# Patient Record
Sex: Female | Born: 1990 | Race: Black or African American | Hispanic: No | Marital: Single | Smoking: Never smoker
Health system: Southern US, Community
[De-identification: ages and names within clinical notes are randomized; demographics above are authoritative.]

## PROBLEM LIST (undated history)

## (undated) DIAGNOSIS — L709 Acne, unspecified: Secondary | ICD-10-CM

## (undated) HISTORY — PX: TONSILLECTOMY: SUR1361

## (undated) HISTORY — DX: Acne, unspecified: L70.9

---
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
## 2012-05-06 ENCOUNTER — Ambulatory Visit (INDEPENDENT_AMBULATORY_CARE_PROVIDER_SITE_OTHER): Payer: PRIVATE HEALTH INSURANCE | Admitting: Family Medicine

## 2012-05-06 VITALS — BP 122/74 | HR 64 | Temp 97.5°F | Resp 16 | Ht 66.5 in | Wt 123.4 lb

## 2012-05-06 DIAGNOSIS — R21 Rash and other nonspecific skin eruption: Secondary | ICD-10-CM

## 2012-05-06 MED ORDER — KETOCONAZOLE 2 % EX CREA: TOPICAL_CREAM | Freq: Every day | CUTANEOUS | Status: DC

## 2012-05-06 NOTE — Progress Notes (Signed)
[facility]:  Office Visit  Chief Complaint:  Chief Complaint  Patient presents with  . Rash    on eye lids bilaterally and around eyes x 3 weeks, has txd w/diaper rash ointment which helped, benedryl cream, benedryl tablets, claritin   . Edema     and itching x 3 weeks eyelids/eyebrows    HPI: Chelsea Lopez is a 21 y.o. female who complains of  Itchy eyelids, flaking skin and some puffiness around eyes x 3 weeks. Tried benadryl PO and topical with no relief. She tried a butt paste cream for diaper rash on her eyelids and that seem to help. She denies any h/o allergies. She is from [place], this started when she came down to school. Parents have a history of allergies and asthma on both sides. Denies using any new foods, new medicines, new make up, new soaps/face wash, clothing, towels, etc.  She is on minocycline for acne.   History reviewed. No pertinent past medical history. Past Surgical History  Procedure Date  . Tonsillectomy    History   Social History  . Marital Status: Single    Spouse Name: N/A    Number of Children: N/A  . Years of Education: N/A   Social History Main Topics  . Smoking status: Never Smoker   . Smokeless tobacco: None  . Alcohol Use: No  . Drug Use: No  . Sexually Active: None   Other Topics Concern  . None   Social History Narrative  . None   Family History  Problem Relation Age of Onset  . Diabetes Maternal Grandmother   . Heart disease Maternal Grandmother   . Stroke Maternal Grandmother   . Diabetes Paternal Grandmother    No Known Allergies Prior to Admission medications   Medication Sig Start Date End Date Taking? Authorizing Provider  sulfamethoxazole-trimethoprim (BACTRIM,SEPTRA) 400-80 MG per tablet Take 1 tablet by mouth 2 (two) times daily.    Historical Provider, MD     ROS: The patient denies fevers, chills, night sweats, unintentional weight loss, chest pain, palpitations, wheezing, dyspnea on  exertion, nausea, vomiting, abdominal pain, dysuria, hematuria, melena, numbness, weakness, or tingling.   All other systems have been reviewed and were otherwise negative with the exception of those mentioned in the HPI and as above.    PHYSICAL EXAM: Filed Vitals:   05/06/12 1557  BP: 122/74  Pulse: 64  Temp: 97.5 F (36.4 C)  Resp: 16   Filed Vitals:   05/06/12 1557  Height: 5' 6.5" (1.689 m)  Weight: 123 lb 6.4 oz (55.974 kg)   Body mass index is 19.62 kg/(m^2).  General: Alert, no acute distress HEENT:  Normocephalic, atraumatic, oropharynx patent.  Cardiovascular:  Regular rate and rhythm, no rubs murmurs or gallops.  No Carotid bruits, radial pulse intact. No pedal edema.  Respiratory: Clear to auscultation bilaterally.  No wheezes, rales, or rhonchi.  No cyanosis, no use of accessory musculature GI: No organomegaly, abdomen is soft and non-tender, positive bowel sounds.  No masses. Skin: + flaky scales on upper eyelid bilaterally, skin around eyes slightly erythematous. ? Seborrhea,  Neurologic: Facial musculature symmetric. Psychiatric: Patient is appropriate throughout our interaction. Lymphatic: No cervical lymphadenopathy Musculoskeletal: Gait intact.   LABS: Results for orders placed in visit on 12/08/11  POCT CBC      Component Value Range   WBC 7.5  4.6 - 10.2 K/uL   Lymph, poc 2.0  0.6 - 3.4   POC LYMPH  PERCENT 27.0  10 - 50 %L   MID (cbc) 0.3  0 - 0.9   POC MID % 3.7  0 - 12 %M   POC Granulocyte 5.2  2 - 6.9   Granulocyte percent 69.3  37 - 80 %G   RBC 4.89  4.04 - 5.48 M/uL   Hemoglobin 13.1  12.2 - 16.2 g/dL   HCT, POC 16.1  09.6 - 47.9 %   MCV 87.6  80 - 97 fL   MCH, POC 26.8 (*) 27 - 31.2 pg   MCHC 30.6 (*) 31.8 - 35.4 g/dL   RDW, POC 04.5     Platelet Count, POC 398  142 - 424 K/uL   MPV 8.5  0 - 99.8 fL  POCT UA - MICROSCOPIC ONLY      Component Value Range   WBC, Ur, HPF, POC 4-5     RBC, urine, microscopic 1-3     Bacteria, U  Microscopic 3+     Mucus, UA pos     Epithelial cells, urine per micros 3-4     Crystals, Ur, HPF, POC neg     Casts, Ur, LPF, POC neg     Yeast, UA neg    POCT URINALYSIS DIPSTICK      Component Value Range   Color, UA yellow     Clarity, UA clear     Glucose, UA neg     Bilirubin, UA neg     Ketones, UA neg     Spec Grav, UA 1.020     Blood, UA neg     pH, UA 7.0     Protein, UA neg     Urobilinogen, UA 0.2     Nitrite, UA neg     Leukocytes, UA Negative    COMPREHENSIVE METABOLIC PANEL      Component Value Range   Sodium 137  135 - 145 mEq/L   Potassium 4.0  3.5 - 5.3 mEq/L   Chloride 100  96 - 112 mEq/L   CO2 31  19 - 32 mEq/L   Glucose, Bld 114 (*) 70 - 99 mg/dL   BUN 11  6 - 23 mg/dL   Creat 4.09  8.11 - 9.14 mg/dL   Total Bilirubin 0.6  0.3 - 1.2 mg/dL   Alkaline Phosphatase 76  39 - 117 U/L   AST 21  0 - 37 U/L   ALT 10  0 - 35 U/L   Total Protein 7.8  6.0 - 8.3 g/dL   Albumin 5.1  3.5 - 5.2 g/dL   Calcium 78.2  8.4 - 95.6 mg/dL  TSH      Component Value Range   TSH 1.511  0.350 - 4.500 uIU/mL  GLUCOSE, POCT (MANUAL RESULT ENTRY)      Component Value Range   POC Glucose 109       EKG/XRAY:   Primary read interpreted by Dr. Conley Rolls at [facility].   ASSESSMENT/PLAN: Encounter Diagnosis  Name Primary?  . Rash and nonspecific skin eruption Yes   ? Eczema vs Seborrheic dermatitis OTC 1% hydrocortisone cream ( discussed with patient risks and benefits, apply sparingly on eyelid and around affected area) If OTC 1% hydrocortisone does not work after 1 week then try Nizoral cream. Use hypoallergenic soaps and makeups.  F/u with dermatology as scheduled    Chesnie Capell PHUONG, DO 05/06/2012 4:26 PM

## 2013-05-24 ENCOUNTER — Ambulatory Visit (INDEPENDENT_AMBULATORY_CARE_PROVIDER_SITE_OTHER): Payer: 59 | Admitting: Family Medicine

## 2013-05-24 VITALS — BP 100/60 | HR 86 | Temp 98.2°F | Resp 16 | Ht 67.0 in | Wt 115.8 lb

## 2013-05-24 DIAGNOSIS — R5381 Other malaise: Secondary | ICD-10-CM

## 2013-05-24 DIAGNOSIS — M436 Torticollis: Secondary | ICD-10-CM

## 2013-05-24 DIAGNOSIS — M542 Cervicalgia: Secondary | ICD-10-CM

## 2013-05-24 DIAGNOSIS — M62838 Other muscle spasm: Secondary | ICD-10-CM

## 2013-05-24 LAB — POCT CBC
Hemoglobin: 12.1 g/dL — AB (ref 12.2–16.2)
MCH, POC: 27.4 pg (ref 27–31.2)
MPV: 8.4 fL (ref 0–99.8)
POC MID %: 2.9 %M (ref 0–12)
RBC: 4.42 M/uL (ref 4.04–5.48)
WBC: 7.7 10*3/uL (ref 4.6–10.2)

## 2013-05-24 LAB — TSH: TSH: 0.975 u[IU]/mL (ref 0.350–4.500)

## 2013-05-24 LAB — C-REACTIVE PROTEIN: CRP: 1.1 mg/dL — ABNORMAL HIGH (ref <0.60)

## 2013-05-24 LAB — CK: Total CK: 57 U/L (ref 7–177)

## 2013-05-24 MED ORDER — DICLOFENAC SODIUM 75 MG PO TBEC: 75.0000 mg | DELAYED_RELEASE_TABLET | Freq: Three times a day (TID) | ORAL | Status: DC

## 2013-05-24 MED ORDER — CYCLOBENZAPRINE HCL 10 MG PO TABS: 10.0000 mg | ORAL_TABLET | Freq: Three times a day (TID) | ORAL | Status: DC | PRN

## 2013-05-24 NOTE — Progress Notes (Signed)
Subjective:  This chart was scribed for Norberto Sorenson, MD by Quintella Reichert, ED scribe.  This patient was seen in room [facility] Room 9 and the patient's care was started at 1:56 PM.   Patient ID: Chelsea Lopez, female    DOB: 02/15/1991, 22 y.o.   MRN: [id number]  Chief Complaint  Patient presents with   Neck Pain    x 5 days    HPI   HPI Comments: Chelsea Lopez is a 22 y.o. female who presents with a chief complaint of progressively-worsening posterior neck pain that began 5 days ago.  Pt denies any injuries or unusual activities that may have caused pain and states she has no prior h/o neck issues or any other musculoskeletal problems.  She states that for the past 5 days she has had sharp pain to the neck that is brought on by neck movement, especially on bending her head down.  Pain is localized to the posterior neck and bilateral sides depending on direction of neck movement.  Her pain became more severe today.  She has attempted to treat pain with Tylenol, without relief.  She has not used heat, ice, massage or any other associated symptoms treatments.  Neck pain stays at the same level throughout the day.  Pt states she does not otherwise feel sick and denies fevers, chills, difficulty swallowing, sore throat, cough, congestion, or pain to any other area.  She states "my ears are plugged" but denies ear pain or tinnitus.  Pt notes that she has intermittent frontal headaches after using her laptop and states "they tell me it's because I need glasses."  She does not feel this is associated with her neck pain  She also notes that she has been "tired a lot" for the last month.  She is in college and states she has an irregular sleeping and eating patterns.  She normally sleeps at least 6 1/2 hours each night.   History reviewed. No pertinent past medical history.     Medication List       This list is accurate as of: 05/24/13  1:56 PM.  Always use your most recent med list.                ISOtretinoin 40 MG capsule  Commonly known as:  ACCUTANE  Take 40 mg by mouth daily.     ketoconazole 2 % cream  Commonly known as:  NIZORAL  Apply topically daily.     sulfamethoxazole-trimethoprim 400-80 MG per tablet  Commonly known as:  BACTRIM,SEPTRA  Take 1 tablet by mouth 2 (two) times daily.        No Known Allergies   Review of Systems  Constitutional: Positive for fatigue. Negative for fever, chills, diaphoresis, appetite change and unexpected weight change.  HENT: Negative for congestion, ear pain, sore throat and trouble swallowing.   Respiratory: Negative for shortness of breath.   Cardiovascular: Negative for chest pain.  Gastrointestinal: Negative for abdominal pain.  Musculoskeletal: Positive for neck pain. Negative for back pain and myalgias.  Skin: Negative for rash.  Allergic/Immunologic: Negative for immunocompromised state.  Neurological: Positive for headaches. Negative for syncope and light-headedness.       BP 100/60   Pulse 86   Temp(Src) 98.2 F (36.8 C) (Oral)   Resp 16   Ht 5\' 7"  (1.702 m)   Wt 115 lb 12.8 oz (52.527 kg)   BMI 18.13 kg/m2   SpO2 100% Objective:   Physical Exam  Nursing  note and vitals reviewed. Constitutional: She is oriented to person, place, and time. She appears well-developed and well-nourished. No distress.  HENT:  Head: Normocephalic and atraumatic.  Right Ear: Tympanic membrane and ear canal normal.  Left Ear: Tympanic membrane and ear canal normal.  Nose: Nose normal.  Mouth/Throat: Uvula is midline, oropharynx is clear and moist and mucous membranes are normal. No oropharyngeal exudate, posterior oropharyngeal edema or posterior oropharyngeal erythema.  Eyes: EOM are normal.  Neck: Normal range of motion. Neck supple. Spinous process tenderness present. No tracheal deviation present. No thyromegaly present.  Point tenderness over C6 Full ROM to C-spine Bilateral paraspinous pain on extremes of  ROM Bilateral trapezius pain with Spurling's maneuver  Cardiovascular: Normal rate.   Pulmonary/Chest: Effort normal. No respiratory distress.  Musculoskeletal: Normal range of motion.       Right shoulder: Normal.       Left shoulder: Normal.       Thoracic back: Normal.  Lymphadenopathy:       Head (right side): No submental, no tonsillar, no preauricular and no posterior auricular adenopathy present.       Head (left side): No submental, no tonsillar, no preauricular and no posterior auricular adenopathy present.    She has no cervical adenopathy.       Right cervical: No superficial cervical, no deep cervical and no posterior cervical adenopathy present.      Left cervical: No superficial cervical, no deep cervical and no posterior cervical adenopathy present.       Right: No supraclavicular adenopathy present.       Left: No supraclavicular adenopathy present.  Neurological: She is alert and oriented to person, place, and time.  Skin: Skin is warm and dry.  Psychiatric: She has a normal mood and affect. Her behavior is normal.      Assessment & Plan:  Muscle spasms of neck - Plan: POCT CBC, POCT SEDIMENTATION RATE, TSH, C-reactive protein, CK  Cervicalgia - Plan: POCT CBC, POCT SEDIMENTATION RATE, TSH, C-reactive protein, CK  Torticollis, acquired - Plan: POCT CBC, POCT SEDIMENTATION RATE, TSH, C-reactive protein, CK - suspect more muscular in etiology so will defer xrays today esp as NKI - if sxs cont, rec RTC for c-spine xrays. Start heat, gentle stretching, and below meds.  Other malaise and fatigue  Meds ordered this encounter  Medications   ISOtretinoin (ACCUTANE) 40 MG capsule    Sig: Take 40 mg by mouth daily.   cyclobenzaprine (FLEXERIL) 10 MG tablet    Sig: Take 1 tablet (10 mg total) by mouth 3 (three) times daily as needed for muscle spasms.    Dispense:  30 tablet    Refill:  0   diclofenac (VOLTAREN) 75 MG EC tablet    Sig: Take 1 tablet (75 mg total) by  mouth 3 (three) times daily.    Dispense:  30 tablet    Refill:  1

## 2013-05-24 NOTE — Patient Instructions (Signed)
Torticollis, Acute You have suddenly (acutely) developed a twisted neck (torticollis). This is usually a self-limited condition. CAUSES  Acute torticollis may be caused by malposition, trauma or infection. Most commonly, acute torticollis is caused by sleeping in an awkward position. Torticollis may also be caused by the flexion, extension or twisting of the neck muscles beyond their normal position. Sometimes, the exact cause may not be known. SYMPTOMS  Usually, there is pain and limited movement of the neck. Your neck may twist to one side. DIAGNOSIS  The diagnosis is often made by physical examination. X-rays, CT scans or MRIs may be done if there is a history of trauma or concern of infection. TREATMENT  For a common, stiff neck that develops during sleep, treatment is focused on relaxing the contracted neck muscle. Medications (including shots) may be used to treat the problem. Most cases resolve in several days. Torticollis usually responds to conservative physical therapy. If left untreated, the shortened and spastic neck muscle can cause deformities in the face and neck. Rarely, surgery is required. HOME CARE INSTRUCTIONS   Use over-the-counter and prescription medications as directed by your caregiver.  Do stretching exercises and massage the neck as directed by your caregiver.  Follow up with physical therapy if needed and as directed by your caregiver. SEEK IMMEDIATE MEDICAL CARE IF:   You develop difficulty breathing or noisy breathing (stridor).  You drool, develop trouble swallowing or have pain with swallowing.  You develop numbness or weakness in the hands or feet.  You have changes in speech or vision.  You have problems with urination or bowel movements.  You have difficulty walking.  You have a fever.  You have increased pain. MAKE SURE YOU:   Understand these instructions.  Will watch your condition.  Will get help right away if you are not doing well or  get worse. Document Released: 07/26/2000 Document Revised: 10/21/2011 Document Reviewed: 09/06/2009 [facility] Patient Information 2014 [place], [place]. Cervical Strain and Sprain (Whiplash) with Rehab Cervical strain and sprains are injuries that commonly occur with "whiplash" injuries. Whiplash occurs when the neck is forcefully whipped backward or forward, such as during a motor vehicle accident. The muscles, ligaments, tendons, discs and nerves of the neck are susceptible to injury when this occurs. SYMPTOMS   Pain or stiffness in the front and/or back of neck  Symptoms may present immediately or up to 24 hours after injury.  Dizziness, headache, nausea and vomiting.  Muscle spasm with soreness and stiffness in the neck.  Tenderness and swelling at the injury site. CAUSES  Whiplash injuries often occur during contact sports or motor vehicle accidents.  RISK INCREASES WITH:  Osteoarthritis of the spine.  Situations that make head or neck accidents or trauma more likely.  High-risk sports (football, rugby, wrestling, hockey, auto racing, gymnastics, diving, contact karate or boxing).  Poor strength and flexibility of the neck.  Previous neck injury.  Poor tackling technique.  Improperly fitted or padded equipment. PREVENTION  Learn and use proper technique (avoid tackling with the head, spearing and head-butting; use proper falling techniques to avoid landing on the head).  Warm up and stretch properly before activity.  Maintain physical fitness:  Strength, flexibility and endurance.  Cardiovascular fitness.  Wear properly fitted and padded protective equipment, such as padded soft collars, for participation in contact sports. PROGNOSIS  Recovery for cervical strain and sprain injuries is dependent on the extent of the injury. These injuries are usually curable in 1 week to 3 months  with appropriate treatment.  RELATED COMPLICATIONS   Temporary numbness and weakness  may occur if the nerve roots are damaged, and this may persist until the nerve has completely healed.  Chronic pain due to frequent recurrence of symptoms.  Prolonged healing, especially if activity is resumed too soon (before complete recovery). TREATMENT  Treatment initially involves the use of ice and medication to help reduce pain and inflammation. It is also important to perform strengthening and stretching exercises and modify activities that worsen symptoms so the injury does not get worse. These exercises may be performed at home or with a therapist. For patients who experience severe symptoms, a soft padded collar may be recommended to be worn around the neck.  Improving your posture may help reduce symptoms. Posture improvement includes pulling your chin and abdomen in while sitting or standing. If you are sitting, sit in a firm chair with your buttocks against the back of the chair. While sleeping, try replacing your pillow with a small towel rolled to 2 inches in diameter, or use a cervical pillow or soft cervical collar. Poor sleeping positions delay healing.  For patients with nerve root damage, which causes numbness or weakness, the use of a cervical traction apparatus may be recommended. Surgery is rarely necessary for these injuries. However, cervical strain and sprains that are present at birth (congenital) may require surgery. MEDICATION   If pain medication is necessary, nonsteroidal anti-inflammatory medications, such as aspirin and ibuprofen, or other minor pain relievers, such as acetaminophen, are often recommended.  Do not take pain medication for 7 days before surgery.  Prescription pain relievers may be given if deemed necessary by your caregiver. Use only as directed and only as much as you need. HEAT AND COLD:   Cold treatment (icing) relieves pain and reduces inflammation. Cold treatment should be applied for 10 to 15 minutes every 2 to 3 hours for inflammation and  pain and immediately after any activity that aggravates your symptoms. Use ice packs or an ice massage.  Heat treatment may be used prior to performing the stretching and strengthening activities prescribed by your caregiver, physical therapist, or athletic trainer. Use a heat pack or a warm soak. SEEK MEDICAL CARE IF:   Symptoms get worse or do not improve in 2 weeks despite treatment.  New, unexplained symptoms develop (drugs used in treatment may produce side effects). EXERCISES RANGE OF MOTION (ROM) AND STRETCHING EXERCISES - Cervical Strain and Sprain These exercises may help you when beginning to rehabilitate your injury. In order to successfully resolve your symptoms, you must improve your posture. These exercises are designed to help reduce the forward-head and rounded-shoulder posture which contributes to this condition. Your symptoms may resolve with or without further involvement from your physician, physical therapist or athletic trainer. While completing these exercises, remember:   Restoring tissue flexibility helps normal motion to return to the joints. This allows healthier, less painful movement and activity.  An effective stretch should be held for at least 20 seconds, although you may need to begin with shorter hold times for comfort.  A stretch should never be painful. You should only feel a gentle lengthening or release in the stretched tissue. STRETCH- Axial Extensors  Lie on your back on the floor. You may bend your knees for comfort. Place a rolled up hand towel or dish towel, about 2 inches in diameter, under the part of your head that makes contact with the floor.  Gently tuck your chin, as if trying  to make a "double chin," until you feel a gentle stretch at the base of your head.  Hold __________ seconds. Repeat __________ times. Complete this exercise __________ times per day.  STRETECH - Axial Extension   Stand or sit on a firm surface. Assume a good posture:  chest up, shoulders drawn back, abdominal muscles slightly tense, knees unlocked (if standing) and feet hip width apart.  Slowly retract your chin so your head slides back and your chin slightly lowers.Continue to look straight ahead.  You should feel a gentle stretch in the back of your head. Be certain not to feel an aggressive stretch since this can cause headaches later.  Hold for __________ seconds. Repeat __________ times. Complete this exercise __________ times per day. STRETCH  Cervical Side Bend   Stand or sit on a firm surface. Assume a good posture: chest up, shoulders drawn back, abdominal muscles slightly tense, knees unlocked (if standing) and feet hip width apart.  Without letting your nose or shoulders move, slowly tip your right / left ear to your shoulder until your feel a gentle stretch in the muscles on the opposite side of your neck.  Hold __________ seconds. Repeat __________ times. Complete this exercise __________ times per day. STRETCH  Cervical Rotators   Stand or sit on a firm surface. Assume a good posture: chest up, shoulders drawn back, abdominal muscles slightly tense, knees unlocked (if standing) and feet hip width apart.  Keeping your eyes level with the ground, slowly turn your head until you feel a gentle stretch along the back and opposite side of your neck.  Hold __________ seconds. Repeat __________ times. Complete this exercise __________ times per day. RANGE OF MOTION - Neck Circles   Stand or sit on a firm surface. Assume a good posture: chest up, shoulders drawn back, abdominal muscles slightly tense, knees unlocked (if standing) and feet hip width apart.  Gently roll your head down and around from the back of one shoulder to the back of the other. The motion should never be forced or painful.  Repeat the motion 10-20 times, or until you feel the neck muscles relax and loosen. Repeat __________ times. Complete the exercise __________ times per  day. STRENGTHENING EXERCISES - Cervical Strain and Sprain These exercises may help you when beginning to rehabilitate your injury. They may resolve your symptoms with or without further involvement from your physician, physical therapist or athletic trainer. While completing these exercises, remember:   Muscles can gain both the endurance and the strength needed for everyday activities through controlled exercises.  Complete these exercises as instructed by your physician, physical therapist or athletic trainer. Progress the resistance and repetitions only as guided.  You may experience muscle soreness or fatigue, but the pain or discomfort you are trying to eliminate should never worsen during these exercises. If this pain does worsen, stop and make certain you are following the directions exactly. If the pain is still present after adjustments, discontinue the exercise until you can discuss the trouble with your clinician. STRENGTH Cervical Flexors, Isometric  Face a wall, standing about 6 inches away. Place a small pillow, a ball about 6-8 inches in diameter, or a folded towel between your forehead and the wall.  Slightly tuck your chin and gently push your forehead into the soft object. Push only with mild to moderate intensity, building up tension gradually. Keep your jaw and forehead relaxed.  Hold 10 to 20 seconds. Keep your breathing relaxed.  Release the tension  slowly. Relax your neck muscles completely before you start the next repetition. Repeat __________ times. Complete this exercise __________ times per day. STRENGTH- Cervical Lateral Flexors, Isometric   Stand about 6 inches away from a wall. Place a small pillow, a ball about 6-8 inches in diameter, or a folded towel between the side of your head and the wall.  Slightly tuck your chin and gently tilt your head into the soft object. Push only with mild to moderate intensity, building up tension gradually. Keep your jaw and  forehead relaxed.  Hold 10 to 20 seconds. Keep your breathing relaxed.  Release the tension slowly. Relax your neck muscles completely before you start the next repetition. Repeat __________ times. Complete this exercise __________ times per day. STRENGTH  Cervical Extensors, Isometric   Stand about 6 inches away from a wall. Place a small pillow, a ball about 6-8 inches in diameter, or a folded towel between the back of your head and the wall.  Slightly tuck your chin and gently tilt your head back into the soft object. Push only with mild to moderate intensity, building up tension gradually. Keep your jaw and forehead relaxed.  Hold 10 to 20 seconds. Keep your breathing relaxed.  Release the tension slowly. Relax your neck muscles completely before you start the next repetition. Repeat __________ times. Complete this exercise __________ times per day. POSTURE AND BODY MECHANICS CONSIDERATIONS - Cervical Strain and Sprain Keeping correct posture when sitting, standing or completing your activities will reduce the stress put on different body tissues, allowing injured tissues a chance to heal and limiting painful experiences. The following are general guidelines for improved posture. Your physician or physical therapist will provide you with any instructions specific to your needs. While reading these guidelines, remember:  The exercises prescribed by your provider will help you have the flexibility and strength to maintain correct postures.  The correct posture provides the optimal environment for your joints to work. All of your joints have less wear and tear when properly supported by a spine with good posture. This means you will experience a healthier, less painful body.  Correct posture must be practiced with all of your activities, especially prolonged sitting and standing. Correct posture is as important when doing repetitive low-stress activities (typing) as it is when doing a single  heavy-load activity (lifting). PROLONGED STANDING WHILE SLIGHTLY LEANING FORWARD When completing a task that requires you to lean forward while standing in one place for a long time, place either foot up on a stationary 2-4 inch high object to help maintain the best posture. When both feet are on the ground, the low back tends to lose its slight inward curve. If this curve flattens (or becomes too large), then the back and your other joints will experience too much stress, fatigue more quickly and can cause pain.  RESTING POSITIONS Consider which positions are most painful for you when choosing a resting position. If you have pain with flexion-based activities (sitting, bending, stooping, squatting), choose a position that allows you to rest in a less flexed posture. You would want to avoid curling into a fetal position on your side. If your pain worsens with extension-based activities (prolonged standing, working overhead), avoid resting in an extended position such as sleeping on your stomach. Most people will find more comfort when they rest with their spine in a more neutral position, neither too rounded nor too arched. Lying on a non-sagging bed on your side with a pillow between your  knees, or on your back with a pillow under your knees will often provide some relief. Keep in mind, being in any one position for a prolonged period of time, no matter how correct your posture, can still lead to stiffness. WALKING Walk with an upright posture. Your ears, shoulders and hips should all line-up. OFFICE WORK When working at a desk, create an environment that supports good, upright posture. Without extra support, muscles fatigue and lead to excessive strain on joints and other tissues. CHAIR:  A chair should be able to slide under your desk when your back makes contact with the back of the chair. This allows you to work closely.  The chair's height should allow your eyes to be level with the upper part of  your monitor and your hands to be slightly lower than your elbows.  Body position:  Your feet should make contact with the floor. If this is not possible, use a foot rest.  Keep your ears over your shoulders. This will reduce stress on your neck and low back. Document Released: 07/29/2005 Document Revised: 10/21/2011 Document Reviewed: 11/10/2008 [facility] Patient Information 2014 [place], [place].

## 2013-05-25 ENCOUNTER — Encounter: Payer: Self-pay | Admitting: Family Medicine

## 2013-06-17 ENCOUNTER — Other Ambulatory Visit: Payer: Self-pay

## 2013-09-01 ENCOUNTER — Ambulatory Visit (INDEPENDENT_AMBULATORY_CARE_PROVIDER_SITE_OTHER): Payer: 59 | Admitting: Emergency Medicine

## 2013-09-01 ENCOUNTER — Ambulatory Visit (HOSPITAL_COMMUNITY)
Admission: RE | Admit: 2013-09-01 | Discharge: 2013-09-01 | Disposition: A | Discharge status: Home / Self Care | Payer: 59 | Source: Ambulatory Visit | Attending: Emergency Medicine | Admitting: Emergency Medicine

## 2013-09-01 VITALS — BP 100/78 | HR 83 | Temp 98.6°F | Resp 16 | Ht 66.0 in | Wt 112.0 lb

## 2013-09-01 DIAGNOSIS — R209 Unspecified disturbances of skin sensation: Secondary | ICD-10-CM | POA: Insufficient documentation

## 2013-09-01 DIAGNOSIS — R202 Paresthesia of skin: Secondary | ICD-10-CM

## 2013-09-01 DIAGNOSIS — R519 Headache, unspecified: Secondary | ICD-10-CM

## 2013-09-01 DIAGNOSIS — Z3200 Encounter for pregnancy test, result unknown: Secondary | ICD-10-CM

## 2013-09-01 DIAGNOSIS — R51 Headache: Secondary | ICD-10-CM | POA: Insufficient documentation

## 2013-09-01 LAB — POCT URINE PREGNANCY: PREG TEST UR: NEGATIVE

## 2013-09-01 NOTE — Patient Instructions (Addendum)
[facility] and register at Emergency Department AS A OUTPATIENT CT. DO NOT REGISTER AS ED PATIENT.   Stop birth control pills until we find out what is causing these symtoms   Driving directions to [facility] 3D2D  [phone]  - more info    [address]  [place], [place] [address]     1. Head south on [address] toward [address]      0.5 mi    2. Sharp left onto [address]      0.6 mi    3. Turn left onto the [address] ramp      0.2 mi    4. Merge onto [address]      3.0 mi    5. Continue straight to stay on [address]      0.4 mi    6. Slight left to stay on [address]      1.2 mi    7. Turn right onto [address]      0.1 mi    8. Turn left onto [address]      361 ft    9. Take the 1st left onto [facility] [address]  Destination will be on the right    Driving directions to [facility] 3D2D  [phone]  - more info    [address]  [place], [place] [address]     1. Head north on [address] toward [address]      344 ft    2. Turn right onto [address]      0.3 mi    3. Slight left to stay on [address]      1.7 mi    4. Turn left onto [address]  Destination will be on the right     0.6 mi     [facility]  [address]   Driving directions to [address], [place], [place] [id number] 3D2D  - more info    [address]  [place], [place] [address]     1. Head south on [address] toward [address]      0.5 mi    2. Sharp left onto [address]      0.6 mi    3. Turn left onto the [address] ramp      0.2 mi    4. Merge onto [address]      3.0 mi    5. Continue straight to stay on [address]      0.4 mi    6. Slight left to stay on [facility] [address]  Destination will be on the right     1.0 mi     [address]  [place], [place] [address]   Driving directions to [facility] 3D2D  [phone]  - more info    [address]  [place], [place]  [address]     1. Head south on [address] toward [address]      0.5 mi    2. Sharp left onto [address]      0.6 mi    3. Turn left onto the [address] ramp      0.2 mi    4. Merge onto [address]  3.0 mi    5. Slight right toward [employer] (signs for [address])      0.2 mi    6. Take the ramp to [employer]      338 ft    7. Turn left onto [address]      0.3 mi    8. Turn left onto [address] will be on the right     0.2 mi     [facility]  [place] to [facility] 3D2D  - more info    [place], [place] [address]     1. Head south on [address] toward [address]      0.7 mi    2. Turn left onto [address]      0.4 mi    3. Take the 3rd right onto [facility] [address]      1.1 mi    4. Take the [address] ramp to [facility]      0.2 mi    5. Merge onto [address]      3.7 mi    6. Take exit [address] for [address] toward [place]      0.3 mi    7. Keep left at the fork, follow signs for Airport      381 ft    8. Keep left at the fork, follow signs for [facility] [address]/[place]      302 ft    9. Turn left onto [address]      2.6 mi    10. Turn right onto [address] [employer] [address] will be on the left     0.2 mi     [employer]

## 2013-09-01 NOTE — Progress Notes (Signed)
[facility] [address], [place] [address] [phone]  Date:  09/01/2013   Name:  Chelsea ArdSimone M Pigeon   DOB:  [birth year]   MRN:  [id number]  PCP:  No primary provider on file.    Chief Complaint: Numbness and Headache   History of Present Illness:  Chelsea Lopez is a 23 y.o. very pleasant female patient who presents with the following:  Patient is a Archivistcollege student.  She noted a severe frontal headache associated with paresthesias in her left forearm.  She has no weakness or pain in her arm.  Says it is like a blood pressure cuff up too tight.  Says it involves all the fingers in her left hand as well.  No history of injury or overuse.  No coryza or cough.  No history of migraines or severe headache.  No fever or chills, no neck pain.  Concerned that it may be a vascular impairment.  No improvement with over the counter medications or other home remedies. Denies other complaint or health concern today.   There are no active problems to display for this patient.   Past Medical History  Diagnosis Date  . Acne     Past Surgical History  Procedure Laterality Date  . Tonsillectomy      History  Substance Use Topics  . Smoking status: Never Smoker   . Smokeless tobacco: Not on file  . Alcohol Use: No    Family History  Problem Relation Age of Onset  . Diabetes Maternal Grandmother   . Heart disease Maternal Grandmother   . Stroke Maternal Grandmother   . Diabetes Paternal Grandmother     No Known Allergies  Medication list has been reviewed and updated.  Current Outpatient Prescriptions on File Prior to Visit  Medication Sig Dispense Refill  . ISOtretinoin (ACCUTANE) 40 MG capsule Take 40 mg by mouth daily.       No current facility-administered medications on file prior to visit.    Review of Systems:  As per HPI, otherwise negative.    Physical Examination: Filed Vitals:   09/01/13 2026  BP: 100/78  Pulse: 83  Temp: 98.6 F  (37 C)  Resp: 16   Filed Vitals:   09/01/13 2026  Height: 5\' 6"  (1.676 m)  Weight: 112 lb (50.803 kg)   Body mass index is 18.09 kg/(m^2). Ideal Body Weight: Weight in (lb) to have BMI = 25: 154.6  GEN: WDWN, NAD, Non-toxic, A & O x 3 HEENT: Atraumatic, Normocephalic. Neck supple. No masses, No LAD.  PRRERLA EOMI  Fundi benign.   Ears and Nose: No external deformity. CV: RRR, No M/G/R. No JVD. No thrill. No extra heart sounds. PULM: CTA B, no wheezes, crackles, rhonchi. No retractions. No resp. distress. No accessory muscle use. ABD: S, NT, ND, +BS. No rebound. No HSM. EXTR: No c/c/e  Cap refill normal.  Radial, ulnar and brachial pulses symmetrical. NEURO Normal gait.  PSYCH: Normally interactive. Conversant. Not depressed or anxious appearing.  Calm demeanor.    Assessment and Plan: Headache Paresthesias left arm CT head  Signed,  Phillips OdorJeffery Adalai Perl, MD

## 2013-09-02 ENCOUNTER — Other Ambulatory Visit: Payer: Self-pay | Admitting: Emergency Medicine

## 2013-09-02 DIAGNOSIS — R51 Headache: Secondary | ICD-10-CM

## 2013-09-02 MED ORDER — BUTALBITAL-APAP-CAFF-COD 50-325-40-30 MG PO CAPS: 1.0000 | ORAL_CAPSULE | ORAL | Status: DC | PRN

## 2013-09-03 ENCOUNTER — Encounter: Payer: Self-pay | Admitting: Neurology

## 2013-09-03 ENCOUNTER — Ambulatory Visit (INDEPENDENT_AMBULATORY_CARE_PROVIDER_SITE_OTHER): Payer: 59 | Admitting: Neurology

## 2013-09-03 VITALS — BP 114/76 | HR 71 | Ht 66.75 in | Wt 113.2 lb

## 2013-09-03 DIAGNOSIS — R29898 Other symptoms and signs involving the musculoskeletal system: Secondary | ICD-10-CM | POA: Insufficient documentation

## 2013-09-03 DIAGNOSIS — R519 Headache, unspecified: Secondary | ICD-10-CM | POA: Insufficient documentation

## 2013-09-03 DIAGNOSIS — R51 Headache: Secondary | ICD-10-CM

## 2013-09-03 NOTE — Progress Notes (Signed)
[facility]  PATIENT: Chelsea Lopez DOB: 1991/02/13  HISTORICAL Chelsea Lopez is a 23 years old right-handed African American female, referred urgent care Dr. Dareen PianoAnderson, accompanied by her boyfriend at today's clinical visit,  She was previously healthy, no history of migraine,  In August 31 2013, she woke up noticed bilateral frontal area pressure headaches, in the meantime she noticed left arm deep achy pain, also involving her left hand, when she tried to air dry her hair, she noticed difficulty holding the dryer using left hand, mild weakness of left arm and hand.  Her symptoms has been persistent since its onset, getting better in the early morning, progressively worse as day goes by, she also complains of light sensitivity, no nausea, no gait difficulty, no left facial, leg involvement. She denies neck pain no visual loss,    REVIEW OF SYSTEMS: Full 14 system review of systems performed and notable only for left arm hand pain, headaches   ALLERGIES: No Known Allergies  HOME MEDICATIONS: Outpatient Prescriptions Prior to Visit  Medication Sig Dispense Refill  . butalbital-acetaminophen-caffeine (FIORICET/CODEINE) 50-325-40-30 MG per capsule Take 1 capsule by mouth every 4 (four) hours as needed for headache.  30 capsule  0  . ISOtretinoin (ACCUTANE) 40 MG capsule Take 40 mg by mouth daily.      . norethindrone-ethinyl estradiol (NECON,BREVICON,MODICON) 0.5-35 MG-MCG tablet Take 1 tablet by mouth daily.         PAST MEDICAL HISTORY: Past Medical History  Diagnosis Date  . Acne     PAST SURGICAL HISTORY: Past Surgical History  Procedure Laterality Date  . Tonsillectomy      FAMILY HISTORY: Family History  Problem Relation Age of Onset  . Diabetes Maternal Grandmother   . Heart disease Maternal Grandmother   . Stroke Maternal Grandmother   . Diabetes Paternal Grandmother     SOCIAL HISTORY:  History   Social History  . Marital Status:  Single    Spouse Name: N/A    Number of Children: N/A  . Years of Education: N/A   Occupational History  . 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 Smoking status: Never Smoker   . Smokeless tobacco: Not on file  . Alcohol Use: No  . Drug Use: No  . Sexual Activity: Not on file   Other Topics Concern  . Not on file   Social History Narrative   Patient is single, has no children   Patient is right handed   Education level she is currently a junior in college   Caffeine consumption is 1 cup daily     PHYSICAL EXAM   Filed Vitals:   09/03/13 1415  BP: 114/76  Pulse: 71  Height: 5' 6.75" (1.695 m)  Weight: 113 lb 4 oz (51.37 kg)     Body mass index is 17.88 kg/(m^2).   Generalized: In no acute distress  Neck: Supple, no carotid bruits   Cardiac: Regular rate rhythm  Pulmonary: Clear to auscultation bilaterally  Musculoskeletal: No deformity  Neurological examination  Mentation: Alert oriented to time, place, history taking, and causual conversation  Cranial nerve II-XII: Pupils were equal round reactive to light extraocular movements were full, Visual field were full on confrontational test. Bilateral fundi were sharp.  Facial sensation and strength were normal. Hearing was intact to finger rubbing bilaterally. Uvula tongue midline.  head turning and shoulder shrug and were normal and symmetric.Tongue protrusion into cheek strength was normal.  Motor: She has mild left shoulder abduction, external rotation, elbow flexion, great weakness, mild left hip flexion weakness.  Sensory: Intact to fine touch, pinprick, preserved vibratory sensation, and proprioception at toes.  Coordination: Normal finger to nose, heel-to-shin bilaterally there was no truncal ataxia  Gait: Rising up from seated position without assistance, normal stance, without trunk ataxia, moderate stride, good arm swing, smooth turning, able to perform tiptoe, and  heel walking without difficulty.   Romberg signs: Negative  Deep tendon reflexes: Brachioradialis 2/2, biceps 2/2, triceps 2/2, patellar 2/2, Achilles 2/2, plantar responses were flexor bilaterally.   DIAGNOSTIC DATA (LABS, IMAGING, TESTING) - I reviewed patient records, labs, notes, testing and imaging myself where available.  Lab Results  Component Value Date   WBC 7.7 05/24/2013   HGB 12.1* 05/24/2013   HCT 39.7 05/24/2013   MCV 89.8 05/24/2013      Component Value Date/Time   NA 137 12/08/2011 1636   K 4.0 12/08/2011 1636   CL 100 12/08/2011 1636   CO2 31 12/08/2011 1636   GLUCOSE 114* 12/08/2011 1636   BUN 11 12/08/2011 1636   CREATININE 0.65 12/08/2011 1636   CALCIUM 10.3 12/08/2011 1636   PROT 7.8 12/08/2011 1636   ALBUMIN 5.1 12/08/2011 1636   AST 21 12/08/2011 1636   ALT 10 12/08/2011 1636   ALKPHOS 76 12/08/2011 1636   BILITOT 0.6 12/08/2011 1636   Lab Results  Component Value Date   TSH 0.975 05/24/2013      ASSESSMENT AND PLAN    23 years old right-handed African American female, previously healthy, presenting with acute onset of bilateral frontal headaches, with associated mild left arm weakness, and deep achy pain   1 I am not sure exact etiology, differentiation diagnosis including  central nervous system space-occupying lesions, vs. complicated migraine, vs. musculoskeletal etiology  2 MRI of brain without contrast,  I will call her result 3 she may try over-the-counter aspirin, NSAIDs Tylenol for symptomatic control 4. RTC with Eber Jones in one month    Levert Feinstein, M.D. Ph.D.  [facility] [address] [place], [place] [address] [phone]

## 2013-09-07 ENCOUNTER — Telehealth: Payer: Self-pay | Admitting: Neurology

## 2013-09-07 ENCOUNTER — Ambulatory Visit: Payer: 59 | Admitting: Emergency Medicine

## 2013-09-07 VITALS — BP 110/70 | HR 83 | Temp 98.9°F | Resp 18 | Wt 113.0 lb

## 2013-09-07 DIAGNOSIS — R519 Headache, unspecified: Secondary | ICD-10-CM

## 2013-09-07 DIAGNOSIS — R51 Headache: Secondary | ICD-10-CM

## 2013-09-07 DIAGNOSIS — R202 Paresthesia of skin: Secondary | ICD-10-CM

## 2013-09-07 DIAGNOSIS — R209 Unspecified disturbances of skin sensation: Secondary | ICD-10-CM

## 2013-09-07 MED ORDER — NAPROXEN SODIUM 550 MG PO TABS: 550.0000 mg | ORAL_TABLET | Freq: Two times a day (BID) | ORAL | Status: DC

## 2013-09-07 NOTE — Progress Notes (Signed)
[facility] [address], [place] [address] [phone]  Date:  09/07/2013   Name:  Chelsea Lopez Vanderhoef   DOB:  1991-07-13   MRN:  [id number]  PCP:  Carmelina DaneAnderson, Desa Rech S, MD    Chief Complaint: Follow-up   History of Present Illness:  Chelsea Lopez Bloodsworth is a 23 y.o. very pleasant female patient who presents with the following:  Seen previously with a very severe persistent headache associated with left arm paresthesias and weakness.  CT negative and she is pending an MR of the brain.  She says she has improved with the headache, it no longer being constant but intermittent.  The numbness in her arm has not improved.  Not on medications.  No improvement with over the counter medications or other home remedies.  Denies other complaint or health concern today.    Patient Active Problem List   Diagnosis Date Noted  . Headache(784.0) 09/03/2013  . Weakness of left arm 09/03/2013    Past Medical History  Diagnosis Date  . Acne     Past Surgical History  Procedure Laterality Date  . Tonsillectomy      History  Substance Use Topics  . Smoking status: Never Smoker   . Smokeless tobacco: Not on file  . Alcohol Use: No    Family History  Problem Relation Age of Onset  . Diabetes Maternal Grandmother   . Heart disease Maternal Grandmother   . Stroke Maternal Grandmother   . Diabetes Paternal Grandmother     No Known Allergies  Medication list has been reviewed and updated.  Current Outpatient Prescriptions on File Prior to Visit  Medication Sig Dispense Refill  . butalbital-acetaminophen-caffeine (FIORICET/CODEINE) 50-325-40-30 MG per capsule Take 1 capsule by mouth every 4 (four) hours as needed for headache.  30 capsule  0  . ISOtretinoin (ACCUTANE) 40 MG capsule Take 40 mg by mouth daily.      . norethindrone-ethinyl estradiol (NECON,BREVICON,MODICON) 0.5-35 MG-MCG tablet Take 1 tablet by mouth daily.       No current facility-administered  medications on file prior to visit.    Review of Systems:  As per HPI, otherwise negative.    Physical Examination: Filed Vitals:   09/07/13 1846  BP: 110/70  Pulse: 83  Temp: 98.9 F (37.2 C)  Resp: 18   Filed Vitals:   09/07/13 1846  Weight: 113 lb (51.256 kg)   Body mass index is 17.84 kg/(Lopez^2). Ideal Body Weight:     GEN: WDWN, NAD, Non-toxic, Alert & Oriented x 3 HEENT: Atraumatic, Normocephalic.  Ears and Nose: No external deformity. EXTR: No clubbing/cyanosis/edema NEURO: Normal gait.  PSYCH: Normally interactive. Conversant. Not depressed or anxious appearing.  Calm demeanor.  Tinnell and Phalen negative.  Good grip strength.    Assessment and Plan: Anaprox  Signed,  Phillips OdorJeffery Loyde Orth, MD

## 2013-09-07 NOTE — Telephone Encounter (Signed)
I will call her MRI result once it has become available

## 2013-09-08 ENCOUNTER — Telehealth: Payer: Self-pay

## 2013-09-08 NOTE — Telephone Encounter (Signed)
Pt has her mri of the brain on 09/09/13 at 830 and is wanting to talk with dr Dareen Pianoanderson about going ahead and scheduling the neck scan and one other that she cant remember patient is needing an answer before  Tomorrow morning she is trying to save cost.  Best number [phone]

## 2013-09-09 ENCOUNTER — Other Ambulatory Visit: Payer: Self-pay | Admitting: Neurology

## 2013-09-09 ENCOUNTER — Ambulatory Visit (INDEPENDENT_AMBULATORY_CARE_PROVIDER_SITE_OTHER): Payer: 59

## 2013-09-09 DIAGNOSIS — R51 Headache: Secondary | ICD-10-CM

## 2013-09-09 DIAGNOSIS — R29898 Other symptoms and signs involving the musculoskeletal system: Secondary | ICD-10-CM

## 2013-09-09 NOTE — Telephone Encounter (Signed)
Already taken care of

## 2013-09-13 ENCOUNTER — Telehealth: Payer: Self-pay | Admitting: Neurology

## 2013-09-13 NOTE — Telephone Encounter (Signed)
Spoke with patient requesting her MRI and MRA results. Please advise

## 2013-09-13 NOTE — Telephone Encounter (Signed)
NEEDS TEST RESULTS

## 2013-09-13 NOTE — Telephone Encounter (Addendum)
Will address the question on followup visit in February 20 fourth

## 2013-09-13 NOTE — Telephone Encounter (Signed)
Chelsea Lopez: Please call patient, MRI and MRA was normal, further questions can be addressed on her followup visit in February

## 2013-09-14 NOTE — Telephone Encounter (Signed)
Spoke to patient and relayed normal MRI and MRA results.  Patient does not want to wait 3 more weeks to see doctor, because her arm is still bothering her.  Can we get her in for a sooner appointment.

## 2013-09-21 ENCOUNTER — Encounter (INDEPENDENT_AMBULATORY_CARE_PROVIDER_SITE_OTHER): Payer: Self-pay

## 2013-09-21 ENCOUNTER — Encounter: Payer: Self-pay | Admitting: Neurology

## 2013-09-21 ENCOUNTER — Ambulatory Visit (INDEPENDENT_AMBULATORY_CARE_PROVIDER_SITE_OTHER): Payer: 59 | Admitting: Neurology

## 2013-09-21 VITALS — BP 109/66 | HR 79 | Ht 65.0 in | Wt 114.0 lb

## 2013-09-21 DIAGNOSIS — R519 Headache, unspecified: Secondary | ICD-10-CM

## 2013-09-21 DIAGNOSIS — R202 Paresthesia of skin: Secondary | ICD-10-CM

## 2013-09-21 DIAGNOSIS — Z0289 Encounter for other administrative examinations: Secondary | ICD-10-CM

## 2013-09-21 DIAGNOSIS — R209 Unspecified disturbances of skin sensation: Secondary | ICD-10-CM

## 2013-09-21 DIAGNOSIS — R51 Headache: Secondary | ICD-10-CM

## 2013-09-21 NOTE — Progress Notes (Signed)
[facility]  PATIENT: Chelsea Lopez DOB: [birth year]  HISTORICAL Viveca is a 23 years old right-handed African American female, referred urgent care Dr. Dareen Piano, accompanied by her boyfriend at today's clinical visit,  She was previously healthy, no history of migraine,  In August 31 2013, she woke up noticed bilateral frontal area pressure headaches, in the meantime she noticed left arm deep achy pain, also involving her left hand, when she tried to air dry her hair, she noticed difficulty holding the dryer using left hand, mild weakness of left arm and hand.  Her symptoms has been persistent since its onset, getting better in the early morning, progressively worse as day goes by, she also complains of light sensitivity, no nausea, no gait difficulty, no left facial, leg involvement. She denies neck pain no visual loss,    UPDATE Feb 10th 2015: She no longer has headaches, MRI and MRA of brain was normal. But she continues to have left hand paresthesia, mild weakness, 5/10  Elbow down, intermittent. No gait difficulty, no right arm paresthesia,    REVIEW OF SYSTEMS: Full 14 system review of systems performed and notable only for left hand paresthesia  ALLERGIES: No Known Allergies  HOME MEDICATIONS: Outpatient Prescriptions Prior to Visit  Medication Sig Dispense Refill  . butalbital-acetaminophen-caffeine (FIORICET/CODEINE) 50-325-40-30 MG per capsule Take 1 capsule by mouth every 4 (four) hours as needed for headache.  30 capsule  0  . ISOtretinoin (ACCUTANE) 40 MG capsule Take 40 mg by mouth daily.      . norethindrone-ethinyl estradiol (NECON,BREVICON,MODICON) 0.5-35 MG-MCG tablet Take 1 tablet by mouth daily.         PAST MEDICAL HISTORY: Past Medical History  Diagnosis Date  . Acne     PAST SURGICAL HISTORY: Past Surgical History  Procedure Laterality Date  . Tonsillectomy      FAMILY HISTORY: Family History  Problem Relation Age of  Onset  . Diabetes Maternal Grandmother   . Heart disease Maternal Grandmother   . Stroke Maternal Grandmother   . Diabetes Paternal Grandmother     SOCIAL HISTORY:  History   Social History  . Marital Status: Single    Spouse Name: N/A    Number of Children: N/A  . Years of Education: N/A   Occupational History  . 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 Smoking status: Never Smoker   . Smokeless tobacco: Not on file  . Alcohol Use: No  . Drug Use: No  . Sexual Activity: Not on file   Other Topics Concern  . 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   Generalized: In no acute distress  Neck: Supple, no carotid bruits   Cardiac: Regular rate rhythm  Pulmonary: Clear to auscultation bilaterally  Musculoskeletal: No deformity  Neurological examination  Mentation: Alert oriented to time, place, history taking, and causual conversation  Cranial nerve II-XII: Pupils were equal round reactive to light extraocular movements were full, Visual field were full on confrontational test. Bilateral fundi were  sharp.  Facial sensation and strength were normal. Hearing was intact to finger rubbing bilaterally. Uvula tongue midline.  head turning and shoulder shrug and were normal and symmetric.Tongue protrusion into cheek strength was normal.  Motor: She has mild left shoulder abduction, external rotation, elbow flexion, great weakness, mild left hip flexion weakness.  Sensory: Intact to fine touch, pinprick, preserved vibratory sensation, and proprioception at toes, with exception of decreased pin prick at left first 3  fingerpad. She has left wrist Tinel's signs    Coordination: Normal finger to nose, heel-to-shin bilaterally there was no truncal ataxia  Gait: Rising up from seated position without assistance, normal stance, without trunk ataxia, moderate stride, good arm swing, smooth turning, able to perform tiptoe, and heel walking without difficulty.   Romberg signs: Negative  Deep tendon reflexes: Brachioradialis 2/2, biceps 2/2, triceps 2/2, patellar 2/2, Achilles 2/2, plantar responses were flexor bilaterally.   DIAGNOSTIC DATA (LABS, IMAGING, TESTING) - I reviewed patient records, labs, notes, testing and imaging myself where available.  Lab Results  Component Value Date   WBC 7.7 05/24/2013   HGB 12.1* 05/24/2013   HCT 39.7 05/24/2013   MCV 89.8 05/24/2013      Component Value Date/Time   NA 137 12/08/2011 1636   K 4.0 12/08/2011 1636   CL 100 12/08/2011 1636   CO2 31 12/08/2011 1636   GLUCOSE 114* 12/08/2011 1636   BUN 11 12/08/2011 1636   CREATININE 0.65 12/08/2011 1636   CALCIUM 10.3 12/08/2011 1636   PROT 7.8 12/08/2011 1636   ALBUMIN 5.1 12/08/2011 1636   AST 21 12/08/2011 1636   ALT 10 12/08/2011 1636   ALKPHOS 76 12/08/2011 1636   BILITOT 0.6 12/08/2011 1636   Lab Results  Component Value Date   TSH 0.975 05/24/2013      ASSESSMENT AND PLAN    23 years old right-handed African American female, previously healthy, presenting with acute onset of bilateral frontal headaches, with associated mild left arm weakness, and deep achy pain   1, Her headache has much improved, with normal MRI of the brain, and MRA of the brain, Differentiation diagnosis including migraine headaches vs. tension headaches  2 her left hand symptoms are suggestive of left carpal tunnel syndrome. Procedures EMG nerve conduction study  3 when necessary NSAIDs, left wrist sprain,  .     Levert FeinsteinYijun Jacklin Zwick, M.D. Ph.D.  [facility] [address], [address] [place], [place] [address] [phone]

## 2013-09-21 NOTE — Procedures (Signed)
   NCS (NERVE CONDUCTION STUDY) WITH EMG (ELECTROMYOGRAPHY) REPORT   STUDY DATE:  February 10th 2015 PATIENT NAME: Chelsea Lopez DOB: 10/16/1990 MRN: [id number]    TECHNOLOGIST: Gearldine ShownLorraine Jones ELECTROMYOGRAPHER: Levert FeinsteinYan, Sangeeta Youse M.D.  CLINICAL INFORMATION:  [age] years old right-handed [place] female, with acute onset of left hand paresthesia, tenderness at thumb base  On examination:  Bilateral upper extremity motor strength is normal, including bilateral abductor pollicis brevis, opponens, she has decreased pinprick at left first 3 fingertips,      FINDINGS: NERVE CONDUCTION STUDY: Bilateral median, ulnar sensory and motor responses were normal.   NEEDLE ELECTROMYOGRAPHY:  Selected needle examination was performed at left upper extremity muscles, left cervical paraspinal muscles   Needle examination of left abductor pollicis brevis pronator teres, Biceps, triceps, extensor digitorum communis was normal, there was no spontaneous activity at left cervical paraspinal muscles, left C5, 6   IMPRESSION:  This is a normal study. there is no electrodiagnostic evidence of left carpal tunnel syndrome. Or left cervical radiculopathy .     INTERPRETING PHYSICIAN:   Levert FeinsteinYan, Jumana Paccione M.D. Ph.D. [facility] [address], [address] [place], [place] [address] [phone]

## 2013-09-23 ENCOUNTER — Encounter: Payer: 59 | Admitting: Neurology

## 2013-09-29 ENCOUNTER — Encounter: Payer: Self-pay | Admitting: *Deleted

## 2013-10-05 ENCOUNTER — Ambulatory Visit: Payer: 59 | Admitting: Neurology

## 2013-11-16 ENCOUNTER — Ambulatory Visit: Payer: 59 | Admitting: Physician Assistant

## 2013-11-16 VITALS — BP 102/70 | HR 64 | Temp 98.1°F | Resp 18 | Ht 68.0 in | Wt 113.0 lb

## 2013-11-16 DIAGNOSIS — R05 Cough: Secondary | ICD-10-CM

## 2013-11-16 DIAGNOSIS — J039 Acute tonsillitis, unspecified: Secondary | ICD-10-CM

## 2013-11-16 DIAGNOSIS — R059 Cough, unspecified: Secondary | ICD-10-CM

## 2013-11-16 DIAGNOSIS — J029 Acute pharyngitis, unspecified: Secondary | ICD-10-CM

## 2013-11-16 DIAGNOSIS — D72829 Elevated white blood cell count, unspecified: Secondary | ICD-10-CM

## 2013-11-16 LAB — POCT CBC
Granulocyte percent: 80.9 %G — AB (ref 37–80)
HCT, POC: 39.4 % (ref 37.7–47.9)
HEMOGLOBIN: 12 g/dL — AB (ref 12.2–16.2)
LYMPH, POC: 1.5 (ref 0.6–3.4)
MCH: 27.2 pg (ref 27–31.2)
MCHC: 30.5 g/dL — AB (ref 31.8–35.4)
MCV: 89.4 fL (ref 80–97)
MID (cbc): 0.5 (ref 0–0.9)
MPV: 8.8 fL (ref 0–99.8)
POC Granulocyte: 8.8 — AB (ref 2–6.9)
POC LYMPH PERCENT: 14.2 %L (ref 10–50)
POC MID %: 4.9 %M (ref 0–12)
Platelet Count, POC: 417 10*3/uL (ref 142–424)
RBC: 4.41 M/uL (ref 4.04–5.48)
RDW, POC: 13.8 %
WBC: 10.9 10*3/uL — AB (ref 4.6–10.2)

## 2013-11-16 LAB — POCT RAPID STREP A (OFFICE): RAPID STREP A SCREEN: NEGATIVE

## 2013-11-16 MED ORDER — IPRATROPIUM BROMIDE 0.06 % NA SOLN: 2.0000 | Freq: Three times a day (TID) | NASAL | Status: DC

## 2013-11-16 MED ORDER — HYDROCOD POLST-CHLORPHEN POLST 10-8 MG/5ML PO LQCR: 5.0000 mL | Freq: Two times a day (BID) | ORAL | Status: DC | PRN

## 2013-11-16 MED ORDER — AMOXICILLIN-POT CLAVULANATE 875-125 MG PO TABS: 1.0000 | ORAL_TABLET | Freq: Two times a day (BID) | ORAL | Status: DC

## 2013-11-16 NOTE — Progress Notes (Signed)
Subjective:    Patient ID: Chelsea Lopez, female    DOB: [birth year], 23 y.o.   MRN: [id number]  HPI Primary Physician: Carmelina Dane, MD  Chief Complaint: URI x 1 day  HPI: 23 y.o. female with history below presents with 1 day history of ST, otalgia, congestion, post nasal drip, sinus pressure, cough and myalgias. Subjective fevers. Cough is not productive and not associated with time of day. No SOB or wheezing. Has had some frontal sinus TTP and sinus headache. Sore throat is the worst symptom. Ears feel full causing a feeling of muffled hearing. No immediate sick contacts. Has been taking Nyquil and Dayquil. Has not taken any medication today.    Past Medical History  Diagnosis Date  . Acne      Home Meds: Prior to Admission medications   Medication Sig Start Date End Date Taking? Authorizing Provider  norethindrone-ethinyl estradiol (NECON,BREVICON,MODICON) 0.5-35 MG-MCG tablet Take 1 tablet by mouth daily.   Yes Historical Provider, MD    Allergies: No Known Allergies  History   Social History  . Marital Status: Single    Spouse Name: N/A    Number of Children: 0  . Years of Education: N/A   Occupational History  .      college student   Social History Main Topics  . Smoking status: Never Smoker   . Smokeless tobacco: Never Used  . Alcohol Use: No  . Drug Use: No  . Sexual Activity: Not on file   Other Topics Concern  . 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 Negative for chills, activity change and appetite change.       Subjective fevers.   HENT: Positive for congestion, ear pain, hearing loss, postnasal drip, sinus pressure and sore throat.        Frontal sinus pressure.   Respiratory: Positive for cough.    Cough started the previous night. Cough is not productive.   Gastrointestinal: Negative for nausea, vomiting and diarrhea.  Musculoskeletal: Positive for myalgias.  Neurological: Positive for headaches.       Frontal sinus headache.        Objective:   Physical Exam  Physical Exam: Blood pressure 102/70, pulse 64, temperature 98.1 F (36.7 C), temperature source Oral, resp. rate 18, height 5\' 8"  (1.727 m), weight 113 lb (51.256 kg), SpO2 100.00%., Body mass index is 17.19 kg/(m^2). General: Well developed, well nourished, in no acute distress. Head: Normocephalic, atraumatic, eyes without discharge, sclera non-icteric, nares are without discharge. Bilateral auditory canals clear, TM's are without perforation, pearly grey and translucent with reflective cone of light bilaterally. Mild frontal sinus TTP. Oral cavity moist, posterior pharynx mildly erythematous with post nasal drip. No exudate, or peritonsillar abscess. Uvula midline.   Neck: Supple. No thyromegaly. Full ROM. No lymphadenopathy. No nuchal rigidity.  Lungs: Clear bilaterally to auscultation without wheezes, rales, or rhonchi. Breathing is unlabored. Heart: RRR with S1 S2. No murmurs, rubs, or gallops appreciated. Msk:  Strength and tone normal for age. Extremities/Skin: Warm and dry. No clubbing or cyanosis. No edema. No rashes or suspicious lesions. Neuro: Alert and oriented X 3. Moves all extremities spontaneously. Gait is normal. CNII-XII grossly in tact. Psych:  Responds  to questions appropriately with a normal affect.   Labs: Results for orders placed in visit on 11/16/13  POCT RAPID STREP A (OFFICE)      Result Value Ref Range   Rapid Strep A Screen Negative  Negative  POCT CBC      Result Value Ref Range   WBC 10.9 (*) 4.6 - 10.2 K/uL   Lymph, poc 1.5  0.6 - 3.4   POC LYMPH PERCENT 14.2  10 - 50 %L   MID (cbc) 0.5  0 - 0.9   POC MID % 4.9  0 - 12 %M   POC Granulocyte 8.8 (*) 2 - 6.9   Granulocyte percent 80.9  (*) 37 - 80 %G   RBC 4.41  4.04 - 5.48 M/uL   Hemoglobin 12.0 (*) 12.2 - 16.2 g/dL   HCT, POC 16.139.4  09.637.7 - 47.9 %   MCV 89.4  80 - 97 fL   MCH, POC 27.2  27 - 31.2 pg   MCHC 30.5 (*) 31.8 - 35.4 g/dL   RDW, POC 04.513.8     Platelet Count, POC 417  142 - 424 K/uL   MPV 8.8  0 - 99.8 fL    Throat culture pending     Assessment & Plan:  23 year female with pharyngitis, cough, and leukocytosis -Augmentin 875/125 mg 1 po bid #20 no RF -Atrovent NS 0.06% 2 sprays each nare bid prn #1 no RF -Tussionex 1 tsp po q 12 hours prn cough #90 mL no RF, SED -New toothbrush -Rest/fluids -RTC precautions   Eula Listenyan Dunn, MHS, PA-C [facility] [address] [place], [place] [address] [phone] [facility] 11/16/2013 3:30 PM

## 2013-11-18 LAB — CULTURE, GROUP A STREP: Organism ID, Bacteria: NORMAL

## 2014-03-16 ENCOUNTER — Ambulatory Visit (INDEPENDENT_AMBULATORY_CARE_PROVIDER_SITE_OTHER): Payer: 59 | Admitting: Family Medicine

## 2014-03-16 VITALS — BP 131/89 | HR 73 | Temp 99.6°F | Resp 16 | Ht 66.0 in | Wt 115.0 lb

## 2014-03-16 DIAGNOSIS — R55 Syncope and collapse: Secondary | ICD-10-CM

## 2014-03-16 LAB — POCT CBC
GRANULOCYTE PERCENT: 57.4 % (ref 37–80)
HCT, POC: 39.4 % (ref 37.7–47.9)
Hemoglobin: 12.4 g/dL (ref 12.2–16.2)
Lymph, poc: 3 (ref 0.6–3.4)
MCH, POC: 27.3 pg (ref 27–31.2)
MCHC: 31.6 g/dL — AB (ref 31.8–35.4)
MCV: 86.5 fL (ref 80–97)
MID (CBC): 0.3 (ref 0–0.9)
MPV: 7.2 fL (ref 0–99.8)
PLATELET COUNT, POC: 415 10*3/uL (ref 142–424)
POC GRANULOCYTE: 4.4 (ref 2–6.9)
POC LYMPH %: 38.6 % (ref 10–50)
POC MID %: 4 % (ref 0–12)
RBC: 4.55 M/uL (ref 4.04–5.48)
RDW, POC: 12.9 %
WBC: 7.7 10*3/uL (ref 4.6–10.2)

## 2014-03-16 LAB — POCT URINALYSIS DIPSTICK
Bilirubin, UA: NEGATIVE
GLUCOSE UA: NEGATIVE
Ketones, UA: NEGATIVE
Leukocytes, UA: NEGATIVE
NITRITE UA: NEGATIVE
Protein, UA: NEGATIVE
RBC UA: NEGATIVE
Spec Grav, UA: <=1.005
UROBILINOGEN UA: 0.2
pH, UA: 6

## 2014-03-16 LAB — POCT UA - MICROSCOPIC ONLY
CASTS, UR, LPF, POC: NEGATIVE
Crystals, Ur, HPF, POC: NEGATIVE
MUCUS UA: NEGATIVE
YEAST UA: NEGATIVE

## 2014-03-16 LAB — GLUCOSE, POCT (MANUAL RESULT ENTRY): POC GLUCOSE: 88 mg/dL (ref 70–99)

## 2014-03-16 LAB — POCT URINE PREGNANCY: PREG TEST UR: NEGATIVE

## 2014-03-16 NOTE — Progress Notes (Signed)
This chart was scribed for Norberto SorensonEva Danyal Whitenack, MD by Luisa DagoPriscilla Tutu, ED Scribe. This patient was seen in room 4 and the patient's care was started at 8:07 PM.  Subjective:    Patient ID: Chelsea Lopez, female    DOB: [birth year], 22 y.o.   MRN: [id number]  Chief Complaint  Patient presents with  . Loss of Consciousness    passed out today at work    HPI Chelsea Lopez is a 23 y.o. female comes into the office complaining of an episode of Syncope. Pt states that she had been standing up for a while at work, when she noticed that she was getting dizzy to she decided to sit down. While sitting down she noticed that all the noise around her began to drawn out and when she tried to pick up her pen off the floor, she states that everything around her went black. When she got up she tried to look around to see if anyone had noticed her symptoms but no one had. Following that episode she went to the restroom, where she had a bowel movement. While at home she started researching her symptoms online and it made her get more anxious of her symptoms.   Pt states that last week she was experiencing mild left sided pain under her ribs. So she is concerned that she may be pregnant. Pt states that she has been drinking a lot today, but has not eaten dinner. Denies any dysuria, hematuria, or increased urinary frequency.    Patient Active Problem List   Diagnosis Date Noted  . Paresthesia 09/21/2013  . Headache(784.0) 09/03/2013  . Weakness of left arm 09/03/2013   Past Medical History  Diagnosis Date  . Acne    Past Surgical History  Procedure Laterality Date  . Tonsillectomy     No Known Allergies Prior to Admission medications   Medication Sig Start Date End Date Taking? Authorizing Provider  norethindrone-ethinyl estradiol (NECON,BREVICON,MODICON) 0.5-35 MG-MCG tablet Take 1 tablet by mouth daily.   Yes Historical Provider, MD  ipratropium (ATROVENT) 0.06 % nasal spray Place 2 sprays into the  nose 3 (three) times daily. 11/16/13   Sondra Bargesyan M Dunn, PA-C   History   Social History  . Marital Status: Single    Spouse Name: N/A    Number of Children: 0  . Years of Education: N/A   Occupational History  .      college student   Social History Main Topics  . Smoking status: Never Smoker   . Smokeless tobacco: Never Used  . Alcohol Use: No  . Drug Use: No  . Sexual Activity: Not on file   Other Topics Concern  . 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  Respiratory: Negative for chest tightness and shortness of breath.   Cardiovascular: Negative for chest pain, palpitations and leg swelling.  Gastrointestinal: Negative for abdominal pain and blood in stool.  Neurological: Positive for syncope. Negative for dizziness, light-headedness and headaches.    Vital Signs: BP 100/60  Pulse 77  Temp(Src) 99.6 F (37.6 C) (Oral)  Resp 16  Ht 5\' 6"  (1.676 m)  Wt 115 lb (52.164 kg)  BMI 18.57 kg/m2  SpO2 100%  Objective:   Physical Exam  Nursing note and vitals reviewed. Constitutional: She is oriented to person, place, and time. She appears well-developed and well-nourished. No distress.  HENT:  Head: Normocephalic and atraumatic.  Eyes: Conjunctivae and EOM are normal.  Neck: Neck supple. No thyromegaly present.  Cardiovascular: Normal rate, regular rhythm, S1 normal and S2 normal.  Exam reveals no friction rub.   Murmur heard.  Systolic murmur is present with a grade of 1/6  Pulses:      Dorsalis pedis pulses are 2+ on the right side, and 2+ on the left side.  Pulmonary/Chest: Effort normal. No respiratory distress. She has no wheezes. She has no rales.  Abdominal: Soft. Bowel sounds are normal. She exhibits no distension.  There is no hepatosplenomegaly, splenomegaly or hepatomegaly.  Musculoskeletal: Normal range of motion.  Lymphadenopathy:    She has no cervical adenopathy.  Neurological: She is alert and oriented to person, place, and time.  Skin: Skin is warm and dry.  Psychiatric: She has a normal mood and affect. Her behavior is normal.   Results for orders placed in visit on 03/16/14  POCT CBC      Result Value Ref Range   WBC 7.7  4.6 - 10.2 K/uL   Lymph, poc 3.0  0.6 - 3.4   POC LYMPH PERCENT 38.6  10 - 50 %L   MID (cbc) 0.3  0 - 0.9   POC MID % 4.0  0 - 12 %M   POC Granulocyte 4.4  2 - 6.9   Granulocyte percent 57.4  37 - 80 %G   RBC 4.55  4.04 - 5.48 M/uL   Hemoglobin 12.4  12.2 - 16.2 g/dL   HCT, POC 16.1  09.6 - 47.9 %   MCV 86.5  80 - 97 fL   MCH, POC 27.3  27 - 31.2 pg   MCHC 31.6 (*) 31.8 - 35.4 g/dL   RDW, POC 04.5     Platelet Count, POC 415  142 - 424 K/uL   MPV 7.2  0 - 99.8 fL  GLUCOSE, POCT (MANUAL RESULT ENTRY)      Result Value Ref Range   POC Glucose 88  70 - 99 mg/dl  POCT URINE PREGNANCY      Result Value Ref Range   Preg Test, Ur Negative    POCT UA - MICROSCOPIC ONLY      Result Value Ref Range   WBC, Ur, HPF, POC 0-2     RBC, urine, microscopic 0-1     Bacteria, U Microscopic trace     Mucus, UA neg     Epithelial cells, urine per micros 0-2     Crystals, Ur, HPF, POC neg     Casts, Ur, LPF, POC neg     Yeast, UA neg    POCT URINALYSIS DIPSTICK      Result Value Ref Range   Color, UA yellow     Clarity, UA clear     Glucose, UA neg     Bilirubin, UA neg     Ketones, UA neg     Spec Grav, UA <=1.005     Blood, UA neg     pH, UA 6.0     Protein, UA neg     Urobilinogen, UA 0.2     Nitrite, UA neg     Leukocytes, UA Negative     9:02 PM--Negative orthostatics   Assessment & Plan:  Syncope, unspecified syncope type - Plan: POCT  CBC, POCT glucose (manual entry), POCT urine pregnancy, POCT UA - Microscopic Only, POCT urinalysis dipstick, hCG, serum,  qualitative, Sedimentation Rate, CANCELED: POCT SEDIMENTATION RATE Suspect vasovagal syncope, pt reassured, RTC if happens again.  I personally performed the services described in this documentation, which was scribed in my presence. The recorded information has been reviewed and considered, and addended by me as needed.  Norberto Sorenson, MD MPH

## 2014-03-16 NOTE — Patient Instructions (Signed)
Vasovagal Syncope, Adult Syncope, commonly known as fainting, is a temporary loss of consciousness. It occurs when the blood flow to the brain is reduced. Vasovagal syncope (also called neurocardiogenic syncope) is a fainting spell in which the blood flow to the brain is reduced because of a sudden drop in heart rate and blood pressure. Vasovagal syncope occurs when the brain and the cardiovascular system (blood vessels) do not adequately communicate and respond to each other. This is the most common cause of fainting. It often occurs in response to fear or some other type of emotional or physical stress. The body has a reaction in which the heart starts beating too slowly or the blood vessels expand, reducing blood pressure. This type of fainting spell is generally considered harmless. However, injuries can occur if a person takes a sudden fall during a fainting spell.  CAUSES  Vasovagal syncope occurs when a person's blood pressure and heart rate decrease suddenly, usually in response to a trigger. Many things and situations can trigger an episode. Some of these include:   Pain.   Fear.   The sight of blood or medical procedures, such as blood being drawn from a vein.   Common activities, such as coughing, swallowing, stretching, or going to the bathroom.   Emotional stress.   Prolonged standing, especially in a warm environment.   Lack of sleep or rest.   Prolonged lack of food.   Prolonged lack of fluids.   Recent illness.  The use of certain drugs that affect blood pressure, such as cocaine, alcohol, marijuana, inhalants, and opiates.  SYMPTOMS  Before the fainting episode, you may:   Feel dizzy or light headed.   Become pale.  Sense that you are going to faint.   Feel like the room is spinning.   Have tunnel vision, only seeing directly in front of you.   Feel sick to your stomach (nauseous).   See spots or slowly lose vision.   Hear ringing in your  ears.   Have a headache.   Feel warm and sweaty.   Feel a sensation of pins and needles. During the fainting spell, you will generally be unconscious for no longer than a couple minutes before waking up and returning to normal. If you get up too quickly before your body can recover, you may faint again. Some twitching or jerky movements may occur during the fainting spell.  DIAGNOSIS  Your caregiver will ask about your symptoms, take a medical history, and perform a physical exam. Various tests may be done to rule out other causes of fainting. These may include blood tests and tests to check the heart, such as electrocardiography, echocardiography, and possibly an electrophysiology study. When other causes have been ruled out, a test may be done to check the body's response to changes in position (tilt table test). TREATMENT  Most cases of vasovagal syncope do not require treatment. Your caregiver may recommend ways to avoid fainting triggers and may provide home strategies for preventing fainting. If you must be exposed to a possible trigger, you can drink additional fluids to help reduce your chances of having an episode of vasovagal syncope. If you have warning signs of an oncoming episode, you can respond by positioning yourself favorably (lying down). If your fainting spells continue, you may be given medicines to prevent fainting. Some medicines may help make you more resistant to repeated episodes of vasovagal syncope. Special exercises or compression stockings may be recommended. In rare cases, the surgical placement  of a pacemaker is considered. HOME CARE INSTRUCTIONS   Learn to identify the warning signs of vasovagal syncope.   Sit or lie down at the first warning sign of a fainting spell. If sitting, put your head down between your legs. If you lie down, swing your legs up in the air to increase blood flow to the brain.   Avoid hot tubs and saunas.  Avoid prolonged  standing.  Drink enough fluids to keep your urine clear or pale yellow. Avoid caffeine.  Increase salt in your diet as directed by your caregiver.   If you have to stand for a long time, perform movements such as:   Crossing your legs.   Flexing and stretching your leg muscles.   Squatting.   Moving your legs.   Bending over.   Only take over-the-counter or prescription medicines as directed by your caregiver. Do not suddenly stop any medicines without asking your caregiver first. SEEK MEDICAL CARE IF:   Your fainting spells continue or happen more frequently in spite of treatment.   You lose consciousness for more than a couple minutes.  You have fainting spells during or after exercising or after being startled.   You have new symptoms that occur with the fainting spells, such as:   Shortness of breath.  Chest pain.   Irregular heartbeat.   You have episodes of twitching or jerky movements that last longer than a few seconds.  You have episodes of twitching or jerky movements without obvious fainting. SEEK IMMEDIATE MEDICAL CARE IF:   You have injuries or bleeding after a fainting spell.   You have episodes of twitching or jerky movements that last longer than 5 minutes.   You have more than one spell of twitching or jerky movements before returning to consciousness after fainting. MAKE SURE YOU:   Understand these instructions.  Will watch your condition.  Will get help right away if you are not doing well or get worse. Document Released: 07/15/2012 Document Reviewed: 07/15/2012 [facility] Patient Information 2015 [place], [place]. This information is not intended to replace advice given to you by your health care provider. Make sure you discuss any questions you have with your health care provider. Near-Syncope Near-syncope (commonly known as near fainting) is sudden weakness, dizziness, or feeling like you might pass out. During an episode of  near-syncope, you may also develop pale skin, have tunnel vision, or feel sick to your stomach (nauseous). Near-syncope may occur when getting up after sitting or while standing for a long time. It is caused by a sudden decrease in blood flow to the brain. This decrease can result from various causes or triggers, most of which are not serious. However, because near-syncope can sometimes be a sign of something serious, a medical evaluation is required. The specific cause is often not determined. HOME CARE INSTRUCTIONS  Monitor your condition for any changes. The following actions may help to alleviate any discomfort you are experiencing:  Have someone stay with you until you feel stable.  Lie down right away and prop your feet up if you start feeling like you might faint. Breathe deeply and steadily. Wait until all the symptoms have passed. Most of these episodes last only a few minutes. You may feel tired for several hours.   Drink enough fluids to keep your urine clear or pale yellow.   If you are taking blood pressure or heart medicine, get up slowly when seated or lying down. Take several minutes to sit and  then stand. This can reduce dizziness.  Follow up with your health care provider as directed. SEEK IMMEDIATE MEDICAL CARE IF:   You have a severe headache.   You have unusual pain in the chest, abdomen, or back.   You are bleeding from the mouth or rectum, or you have black or tarry stool.   You have an irregular or very fast heartbeat.   You have repeated fainting or have seizure-like jerking during an episode.   You faint when sitting or lying down.   You have confusion.   You have difficulty walking.   You have severe weakness.   You have vision problems.  MAKE SURE YOU:   Understand these instructions.  Will watch your condition.  Will get help right away if you are not doing well or get worse. Document Released: 07/29/2005 Document Revised: 08/03/2013  Document Reviewed: 01/01/2013 [facility] Patient Information 2015 [place], [place]. This information is not intended to replace advice given to you by your health care provider. Make sure you discuss any questions you have with your health care provider.

## 2014-03-17 ENCOUNTER — Telehealth: Payer: Self-pay

## 2014-03-17 ENCOUNTER — Encounter: Payer: Self-pay | Admitting: Family Medicine

## 2014-03-17 LAB — SEDIMENTATION RATE: SED RATE: 8 mm/h (ref 0–22)

## 2014-03-17 LAB — HCG, SERUM, QUALITATIVE: Preg, Serum: NEGATIVE

## 2014-03-17 NOTE — Telephone Encounter (Signed)
Please call the patient and let her know that I reviewed her labs and they were all neg.

## 2014-03-17 NOTE — Telephone Encounter (Signed)
Patient called back upset about not getting the results of her labs done yesterday. Per patient she was told they would be back today and she would get a call this morning. I told patient that normally the labs take about a week to get results. Patient stated she was told her labs were rushed and would be in today. I let patient know Dr. Clelia CroftShaw was out today but would be at our appointment center tomorrow. Patient requested to have another doctor read her results and give them to her. I let patient know the labs must be read by the doctor whom ordered them.  I let patient know I would put a message in requesting Dr. Clelia CroftShaw to call her tomorrow if possible. Patient was ok waiting till tomorrow but stated she would call back if she does not hear back from our office. Patient's call back number is [phone]

## 2014-03-17 NOTE — Telephone Encounter (Signed)
Spoke to pt, , she is aware  dr Clelia Croftshaw has not had the opportunity to review these, once she has we will call her with the results.

## 2014-03-17 NOTE — Telephone Encounter (Signed)
SHAW - Pt called saying you did her lab tests stat yesterday and that you were going to call her between 7-8 a.m. this morning with the results.  The lab says the tests are back, but have not been reveiwed.  Please call asap [phone]

## 2014-03-17 NOTE — Telephone Encounter (Signed)
Pt.notified

## 2014-03-27 ENCOUNTER — Ambulatory Visit (INDEPENDENT_AMBULATORY_CARE_PROVIDER_SITE_OTHER): Payer: 59 | Admitting: Family Medicine

## 2014-03-27 VITALS — BP 116/68 | HR 73 | Temp 98.3°F | Resp 16 | Ht 66.75 in | Wt 115.0 lb

## 2014-03-27 DIAGNOSIS — L509 Urticaria, unspecified: Secondary | ICD-10-CM

## 2014-03-27 MED ORDER — PREDNISONE 20 MG PO TABS: 40.0000 mg | ORAL_TABLET | Freq: Every day | ORAL | Status: DC

## 2014-03-27 NOTE — Patient Instructions (Signed)
Hives Hives are itchy, red, swollen areas of the skin. They can vary in size and location on your body. Hives can come and go for hours or several days (acute hives) or for several weeks (chronic hives). Hives do not spread from person to person (noncontagious). They may get worse with scratching, exercise, and emotional stress. CAUSES   Allergic reaction to food, additives, or drugs.  Infections, including the common cold.  Illness, such as vasculitis, lupus, or thyroid disease.  Exposure to sunlight, heat, or cold.  Exercise.  Stress.  Contact with chemicals. SYMPTOMS   Red or white swollen patches on the skin. The patches may change size, shape, and location quickly and repeatedly.  Itching.  Swelling of the hands, feet, and face. This may occur if hives develop deeper in the skin. DIAGNOSIS  Your caregiver can usually tell what is wrong by performing a physical exam. Skin or blood tests may also be done to determine the cause of your hives. In some cases, the cause cannot be determined. TREATMENT  Mild cases usually get better with medicines such as antihistamines. Severe cases may require an emergency epinephrine injection. If the cause of your hives is known, treatment includes avoiding that trigger.  HOME CARE INSTRUCTIONS   Avoid causes that trigger your hives.  Take antihistamines as directed by your caregiver to reduce the severity of your hives. Non-sedating or low-sedating antihistamines are usually recommended. Do not drive while taking an antihistamine.  Take any other medicines prescribed for itching as directed by your caregiver.  Wear loose-fitting clothing.  Keep all follow-up appointments as directed by your caregiver. SEEK MEDICAL CARE IF:   You have persistent or severe itching that is not relieved with medicine.  You have painful or swollen joints. SEEK IMMEDIATE MEDICAL CARE IF:   You have a fever.  Your tongue or lips are swollen.  You have  trouble breathing or swallowing.  You feel tightness in the throat or chest.  You have abdominal pain. These problems may be the first sign of a life-threatening allergic reaction. Call your local emergency services (911 in [place] MAKE SURE YOU:   Understand these instructions.  Will watch your condition.  Will get help right away if you are not doing well or get worse. Document Released: 07/29/2005 Document Revised: 08/03/2013 Document Reviewed: 10/22/2011 [employer] Patient Information 2015 [employer], [employer]. This information is not intended to replace advice given to you by your health care provider. Make sure you discuss any questions you have with your health care provider.  

## 2014-03-27 NOTE — Progress Notes (Signed)
Subjective:    Patient ID: Chelsea Lopez, female    DOB: 12-28-1990, 23 y.o.   MRN: [id number] This chart was scribed for Elvina SidleKurt Kiriana Worthington, MD by Chestine SporeSoijett Blue, ED Scribe. The patient was seen in room 12 at 12:13 PM.   Chief Complaint  Patient presents with  . Urticaria    On pt's face, x1 week but started to get worse in the last few days, unknown cause    HPI HPI Comments: Chelsea Lopez is a 23 y.o. female with a medical hx of acne who presents today complaining of Urticaria onset 1 week. She states that she has hives on her face that hurt really bad. She states that it started out as bumps. She states that she does not know if she had anything new in the past.  She states that she is having associated symptoms of itching. She states that she has tried benadryl with no relief for her symptoms. She states that the benadryl would take the symptoms away and then it would come back. She states that [employer] came and spread in the drains for gnats that they had. She denies swelling in her mouth, no hives on her arms or legs. She states that her dog had an allergic reaction that began last week. She states that her dog is on prednisone. She states that was working but is taking a week off. 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  Patient Active Problem List   Diagnosis Date Noted  . Paresthesia 09/21/2013  . Headache(784.0) 09/03/2013  . Weakness of left arm 09/03/2013   Past Medical History  Diagnosis Date  . Acne    Past Surgical History  Procedure Laterality Date  . Tonsillectomy     No Known Allergies Prior to Admission medications   Medication Sig Start Date End Date Taking? Authorizing Provider  ipratropium (ATROVENT) 0.06 % nasal spray Place 2 sprays into the nose 3 (three) times daily. 11/16/13   Sondra Bargesyan M Dunn, PA-C  norethindrone-ethinyl estradiol (NECON,BREVICON,MODICON) 0.5-35 MG-MCG tablet Take 1 tablet by mouth daily.    Historical Provider, MD       Review of Systems  Skin:       Urticaria on the face and itching.        Objective:   Physical Exam  Nursing note and vitals reviewed. Constitutional: She is oriented to person, place, and time. She appears well-developed and well-nourished. No distress.  HENT:  Head: Normocephalic and atraumatic.  Eyes: EOM are normal.  Neck: Neck supple. No tracheal deviation present.  Cardiovascular: Normal rate.   Pulmonary/Chest: Effort normal. No respiratory distress.  Musculoskeletal: Normal range of motion.  Neurological: She is alert and oriented to person, place, and time.  Skin: Skin is warm and dry.  Hives on face only. No bleeding at this time.   Psychiatric: She has a normal mood and affect. Her behavior is normal.         BP 116/68  Pulse 73  Temp(Src) 98.3 F (36.8 C)  Resp 16  Ht 5' 6.75" (1.695 m)  Wt 115 lb (52.164 kg)  BMI 18.16 kg/m2  SpO2 99%  Assessment & Plan:  DIAGNOSTIC STUDIES: Oxygen Saturation is 99% on room air, normal by my interpretation.    COORDINATION OF CARE: 12:19 PM-Discussed treatment plan which includes Prednisone with pt at bedside and pt agreed to plan.   1. Hives     I personally performed the services described  in this documentation, which was scribed in my presence. The recorded information has been reviewed and is accurate.  Hives - Plan: predniSONE (DELTASONE) 20 MG tablet  Signed, Elvina Sidle, MD

## 2014-04-05 ENCOUNTER — Ambulatory Visit (INDEPENDENT_AMBULATORY_CARE_PROVIDER_SITE_OTHER): Payer: 59 | Admitting: Family Medicine

## 2014-04-05 VITALS — BP 96/70 | HR 78 | Temp 98.3°F | Resp 18 | Wt 114.0 lb

## 2014-04-05 DIAGNOSIS — L509 Urticaria, unspecified: Secondary | ICD-10-CM

## 2014-04-05 MED ORDER — METHYLPREDNISOLONE ACETATE 80 MG/ML IJ SUSP
80.0000 mg | Freq: Once | INTRAMUSCULAR | Status: AC
  Administered 2014-04-05: 80 mg via INTRAMUSCULAR

## 2014-04-05 NOTE — Progress Notes (Signed)
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 This started about 10 days ago and gave her prednisone which worked very nicely. Nevertheless over the last several days had started him back on her face.  Objective: Minimal erythema in patches on face  Lungs clear, oropharynx clear Neck: Supple no adenopathy  Assessment: Recurrent hives, cause unclear   Hives - Plan: methylPREDNISolone acetate (DEPO-MEDROL) injection 80 mg, Ambulatory referral to Allergy  Signed, Elvina Sidle, MD

## 2014-04-05 NOTE — Patient Instructions (Signed)
Hives Hives are itchy, red, swollen areas of the skin. They can vary in size and location on your body. Hives can come and go for hours or several days (acute hives) or for several weeks (chronic hives). Hives do not spread from person to person (noncontagious). They may get worse with scratching, exercise, and emotional stress. CAUSES   Allergic reaction to food, additives, or drugs.  Infections, including the common cold.  Illness, such as vasculitis, lupus, or thyroid disease.  Exposure to sunlight, heat, or cold.  Exercise.  Stress.  Contact with chemicals. SYMPTOMS   Red or white swollen patches on the skin. The patches may change size, shape, and location quickly and repeatedly.  Itching.  Swelling of the hands, feet, and face. This may occur if hives develop deeper in the skin. DIAGNOSIS  Your caregiver can usually tell what is wrong by performing a physical exam. Skin or blood tests may also be done to determine the cause of your hives. In some cases, the cause cannot be determined. TREATMENT  Mild cases usually get better with medicines such as antihistamines. Severe cases may require an emergency epinephrine injection. If the cause of your hives is known, treatment includes avoiding that trigger.  HOME CARE INSTRUCTIONS   Avoid causes that trigger your hives.  Take antihistamines as directed by your caregiver to reduce the severity of your hives. Non-sedating or low-sedating antihistamines are usually recommended. Do not drive while taking an antihistamine.  Take any other medicines prescribed for itching as directed by your caregiver.  Wear loose-fitting clothing.  Keep all follow-up appointments as directed by your caregiver. SEEK MEDICAL CARE IF:   You have persistent or severe itching that is not relieved with medicine.  You have painful or swollen joints. SEEK IMMEDIATE MEDICAL CARE IF:   You have a fever.  Your tongue or lips are swollen.  You have  trouble breathing or swallowing.  You feel tightness in the throat or chest.  You have abdominal pain. These problems may be the first sign of a life-threatening allergic reaction. Call your local emergency services (911 in [place] MAKE SURE YOU:   Understand these instructions.  Will watch your condition.  Will get help right away if you are not doing well or get worse. Document Released: 07/29/2005 Document Revised: 08/03/2013 Document Reviewed: 10/22/2011 [employer] Patient Information 2015 [employer], [employer]. This information is not intended to replace advice given to you by your health care provider. Make sure you discuss any questions you have with your health care provider.  

## 2014-04-11 ENCOUNTER — Ambulatory Visit (INDEPENDENT_AMBULATORY_CARE_PROVIDER_SITE_OTHER): Payer: 59 | Admitting: Emergency Medicine

## 2014-04-11 ENCOUNTER — Telehealth: Payer: Self-pay

## 2014-04-11 VITALS — BP 114/68 | HR 77 | Temp 98.4°F | Resp 16 | Ht 66.5 in | Wt 114.2 lb

## 2014-04-11 DIAGNOSIS — L309 Dermatitis, unspecified: Secondary | ICD-10-CM

## 2014-04-11 DIAGNOSIS — L259 Unspecified contact dermatitis, unspecified cause: Secondary | ICD-10-CM

## 2014-04-11 MED ORDER — PIMECROLIMUS 1 % EX CREA: TOPICAL_CREAM | Freq: Two times a day (BID) | CUTANEOUS | Status: DC

## 2014-04-11 NOTE — Telephone Encounter (Signed)
Dr Dareen Piano  The medication prescribed to patient requires a prior authorization.   Patient cannot wait one minute for this to be done.  She wants an alternative medication that she can pick up NOW.   pimecrolimus (ELIDEL) 1 % cream   [facility]   [phone]

## 2014-04-11 NOTE — Telephone Encounter (Signed)
Spoke to pt, she is aware , [facility] processed the prior approval, and called pharm to confirm.

## 2014-04-11 NOTE — Telephone Encounter (Signed)
Please advise 

## 2014-04-11 NOTE — Progress Notes (Signed)
[facility] [address] [place] [address] [phone]  Date:  04/11/2014   Name:  Chelsea Lopez   DOB:  1990-10-16   MRN:  [id number]  PCP:  Carmelina Dane, MD    Chief Complaint: Urticaria   History of Present Illness:  Chelsea Lopez is a 23 y.o. very pleasant female patient who presents with the following:  Has a rash on her cheeks and chin and has been treated for hives with steroids with transient improvement.   Now has recurrence No history of allergen exposure. No improvement with over the counter medications or other home remedies. Denies other complaint or health concern today.   Patient Active Problem List   Diagnosis Date Noted  . Paresthesia 09/21/2013  . Headache(784.0) 09/03/2013  . Weakness of left arm 09/03/2013    Past Medical History  Diagnosis Date  . Acne     Past Surgical History  Procedure Laterality Date  . Tonsillectomy      History  Substance Use Topics  . Smoking status: Never Smoker   . Smokeless tobacco: Never Used  . Alcohol Use: No    Family History  Problem Relation Age of Onset  . Diabetes Maternal Grandmother   . Heart disease Maternal Grandmother   . Stroke Maternal Grandmother   . Diabetes Paternal Grandmother     No Known Allergies  Medication list has been reviewed and updated.  Current Outpatient Prescriptions on File Prior to Visit  Medication Sig Dispense Refill  . norethindrone-ethinyl estradiol (NECON,BREVICON,MODICON) 0.5-35 MG-MCG tablet Take 1 tablet by mouth daily.       No current facility-administered medications on file prior to visit.    Review of Systems:  As per HPI, otherwise negative.    Physical Examination: Filed Vitals:   04/11/14 1340  BP: 114/68  Pulse: 77  Temp: 98.4 F (36.9 C)  Resp: 16   Filed Vitals:   04/11/14 1340  Height: 5' 6.5" (1.689 m)  Weight: 114 lb 3.2 oz (51.801 kg)   Body mass index is 18.16 kg/(m^2). Ideal Body Weight:  Weight in (lb) to have BMI = 25: 156.9   GEN: WDWN, NAD, Non-toxic, Alert & Oriented x 3 HEENT: Atraumatic, Normocephalic.  Ears and Nose: No external deformity. EXTR: No clubbing/cyanosis/edema NEURO: Normal gait.  PSYCH: Normally interactive. Conversant. Not depressed or anxious appearing.  Calm demeanor.  SKIN:  Eczematous changes  Assessment and Plan: Eczema elidel Follow up with allergist  Signed,  Phillips Odor, MD

## 2014-04-11 NOTE — Patient Instructions (Signed)
Eczema Eczema, also called atopic dermatitis, is a skin disorder that causes inflammation of the skin. It causes a red rash and dry, scaly skin. The skin becomes very itchy. Eczema is generally worse during the cooler winter months and often improves with the warmth of summer. Eczema usually starts showing signs in infancy. Some children outgrow eczema, but it may last through adulthood.  CAUSES  The exact cause of eczema is not known, but it appears to run in families. People with eczema often have a family history of eczema, allergies, asthma, or hay fever. Eczema is not contagious. Flare-ups of the condition may be caused by:   Contact with something you are sensitive or allergic to.   Stress. SIGNS AND SYMPTOMS  Dry, scaly skin.   Red, itchy rash.   Itchiness. This may occur before the skin rash and may be very intense.  DIAGNOSIS  The diagnosis of eczema is usually made based on symptoms and medical history. TREATMENT  Eczema cannot be cured, but symptoms usually can be controlled with treatment and other strategies. A treatment plan might include:  Controlling the itching and scratching.   Use over-the-counter antihistamines as directed for itching. This is especially useful at night when the itching tends to be worse.   Use over-the-counter steroid creams as directed for itching.   Avoid scratching. Scratching makes the rash and itching worse. It may also result in a skin infection (impetigo) due to a break in the skin caused by scratching.   Keeping the skin well moisturized with creams every day. This will seal in moisture and help prevent dryness. Lotions that contain alcohol and water should be avoided because they can dry the skin.   Limiting exposure to things that you are sensitive or allergic to (allergens).   Recognizing situations that cause stress.   Developing a plan to manage stress.  HOME CARE INSTRUCTIONS   Only take over-the-counter or  prescription medicines as directed by your health care provider.   Do not use anything on the skin without checking with your health care provider.   Keep baths or showers short (5 minutes) in warm (not hot) water. Use mild cleansers for bathing. These should be unscented. You may add nonperfumed bath oil to the bath water. It is best to avoid soap and bubble bath.   Immediately after a bath or shower, when the skin is still damp, apply a moisturizing ointment to the entire body. This ointment should be a petroleum ointment. This will seal in moisture and help prevent dryness. The thicker the ointment, the better. These should be unscented.   Keep fingernails cut short. Children with eczema may need to wear soft gloves or mittens at night after applying an ointment.   Dress in clothes made of cotton or cotton blends. Dress lightly, because heat increases itching.   A child with eczema should stay away from anyone with fever blisters or cold sores. The virus that causes fever blisters (herpes simplex) can cause a serious skin infection in children with eczema. SEEK MEDICAL CARE IF:   Your itching interferes with sleep.   Your rash gets worse or is not better within 1 week after starting treatment.   You see pus or soft yellow scabs in the rash area.   You have a fever.   You have a rash flare-up after contact with someone who has fever blisters.  Document Released: 07/26/2000 Document Revised: 05/19/2013 Document Reviewed: 03/01/2013 ExitCare Patient Information 2015 [employer]. This information   is not intended to replace advice given to you by your health care provider. Make sure you discuss any questions you have with your health care provider.  

## 2014-08-31 ENCOUNTER — Ambulatory Visit (INDEPENDENT_AMBULATORY_CARE_PROVIDER_SITE_OTHER): Payer: 59 | Admitting: Family Medicine

## 2014-08-31 VITALS — BP 96/62 | HR 85 | Temp 98.3°F | Resp 16 | Ht 66.0 in | Wt 115.4 lb

## 2014-08-31 DIAGNOSIS — B09 Unspecified viral infection characterized by skin and mucous membrane lesions: Secondary | ICD-10-CM

## 2014-08-31 MED ORDER — PREDNISONE 20 MG PO TABS: 40.0000 mg | ORAL_TABLET | Freq: Every day | ORAL | Status: AC

## 2014-08-31 NOTE — Patient Instructions (Signed)
Viral Exanthems, Adult °Many viral infections of the skin are called viral exanthems. Exanthem is another name for a rash or skin eruption. The most common viral exanthems include the following: °· German measles or rubella. °· Measles or rubeola. °· Roseola. °· Parvovirus B19 (Erythema infectiosum or Fifth disease). °· Chickenpox or varicella. °DIAGNOSIS  °Sometimes, other problems may cause a rash that looks like a viral exanthem. Most often, your caregiver can determine whether you have a viral exanthem by looking at the rash. They usually have distinct patterns or appearance. Lab work may be done if the diagnosis is uncertain. Sometimes, a small tissue sample (biopsy) of the rash may need to be taken. °TREATMENT  °Immunizations have led to a decrease in the number of cases of measles, mumps, and rubella. Viral exanthems may require clinical treatment if a bacterial infection or other problems follow. The rash may be associated with: °· Minor sore throat. °· Aches and pains. °· Runny nose. °· Watery eyes. °· Tiredness. °· Some coughs. °· Gastrointestinal infections causing nausea, vomiting, and diarrhea. °Viral exanthems do not respond to antibiotic medicines, because they are not caused by bacteria. °HOME CARE INSTRUCTIONS  °· Only take over-the-counter or prescription medicines for pain, discomfort, diarrhea, or fever as directed by your caregiver. °· Drink enough water and fluids to keep your urine clear or pale yellow. °SEEK MEDICAL CARE IF: °· You develop swollen neck glands. This may feel like lumps or bumps in the neck. °· You develop tenderness over your sinuses. °· You are not feeling partly better after 3 days. °· You develop muscle aches. °· You are feeling more tired than you would expect. °· You get a persistent cough with mucus. °SEEK IMMEDIATE MEDICAL CARE IF:  °· You have a fever. °· You develop red eyes or eye pain. °· You develop sores in your mouth and difficulty drinking or eating. °· You  develop a sore throat with pus and difficulty swallowing. °· You develop neck pain or a stiff neck. °· You develop a severe headache. °· You develop vomiting that will not stop. °Document Released: 10/19/2002 Document Revised: 10/21/2011 Document Reviewed: 10/16/2010 °ExitCare® Patient Information ©2015 [employer]. This information is not intended to replace advice given to you by your health care provider. Make sure you discuss any questions you have with your health care provider. ° °

## 2014-08-31 NOTE — Progress Notes (Signed)
   Subjective:    Patient ID: Chelsea Lopez, female    DOB: 1991/07/07, 24 y.o.   MRN: [id number] This chart was scribed for Elvina SidleKurt Starnisha Batrez, MD by Jolene Provostobert Halas, Medical Scribe. This patient was seen in Room 14 and the patient's care was started a 2:42 PM.  Chief Complaint  Patient presents with  . Urticaria    on arms , upper back  . Diarrhea    since Saturday . Monday started Pepto-Bismol    HPI HPI Comments: Chelsea Lopez is a 24 y.o. female who [facility] complaining of a rash that started two days ago. Pt also states that she had diarrhea starting four days ago with associated painful cramps, and started pepto bysmol. Pt states her stools became more firm and darker. Pt states her rash is itchy and red. Pt states her cramps have resolved today. Pt denies abnormal foods.  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   Review of Systems  Constitutional: Negative for fever and chills.  Gastrointestinal: Positive for abdominal pain and diarrhea.  Skin: Positive for rash.       Objective:   Physical Exam  Constitutional: She is oriented to person, place, and time. She appears well-developed and well-nourished.  HENT:  Head: Normocephalic and atraumatic.  Eyes: Pupils are equal, round, and reactive to light.  Neck: Neck supple.  Cardiovascular: Normal rate and regular rhythm.   Pulmonary/Chest: Effort normal and breath sounds normal. No respiratory distress.  Neurological: She is alert and oriented to person, place, and time.  Skin: Skin is warm and dry.  Psychiatric: She has a normal mood and affect. Her behavior is normal.  Nursing note and vitals reviewed.  very fine symmetric rash on upper extremities, cheeks, upper chest.    Assessment & Plan:   This chart was scribed in my presence and reviewed by me personally.    ICD-9-CM ICD-10-CM   1. Viral exanthem 057.9 B09 predniSONE (DELTASONE) 20 MG tablet     Signed, Elvina SidleKurt Samreet [place]field,  MD

## 2014-09-05 ENCOUNTER — Encounter: Payer: Self-pay | Admitting: *Deleted

## 2015-12-17 IMAGING — CT CT HEAD W/O CM
2 series · 16 of 30 positions shown, 20 images · non-contrast
Comparison: CT scan dated 12/09/2011

CLINICAL DATA: Headache.  Paresthesias of the left forearm.

EXAM:
CT HEAD WITHOUT CONTRAST
TECHNIQUE: Contiguous axial images were obtained from the base of the skull
through the vertex without intravenous contrast.

[Series 2: head w/o · axial · non-contrast · 0.48mm/px · z∈[+1517,+1637]mm · 13 of 30 slices shown, 17 images]
[im 3/30  brain]
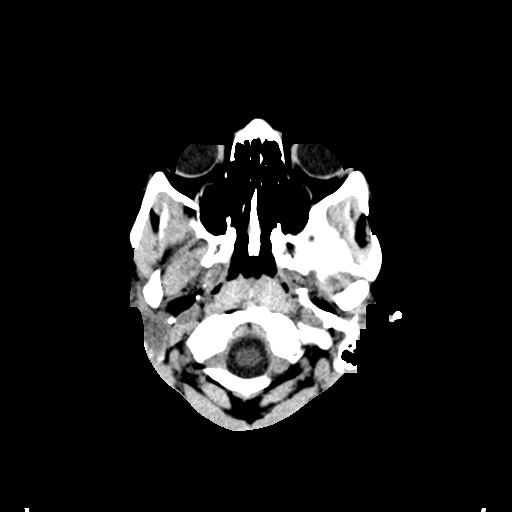
[im 3/30  bone]
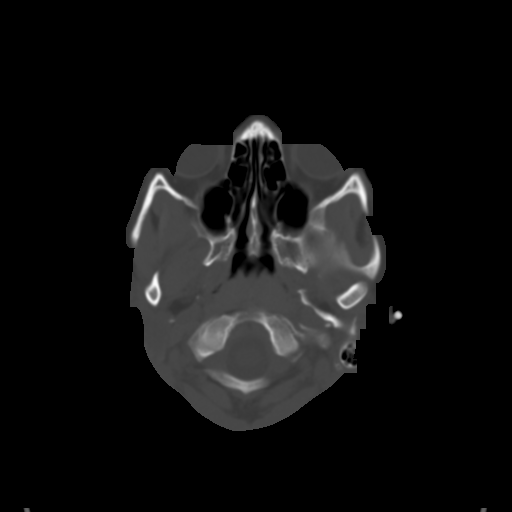
[im 5/30  brain]
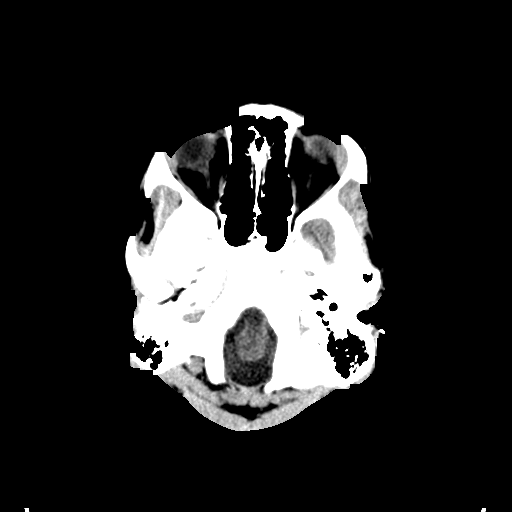
[im 7/30  brain]
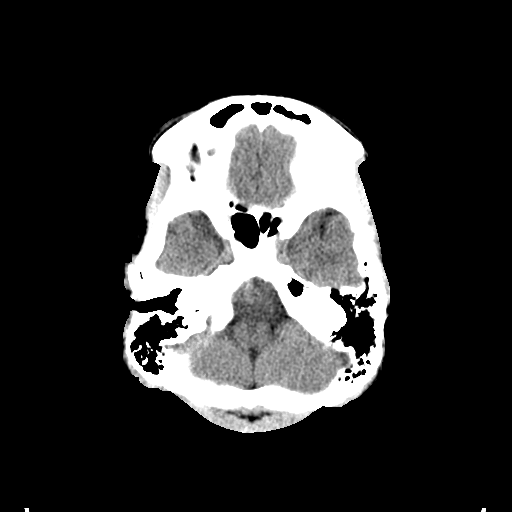
[im 9/30  brain]
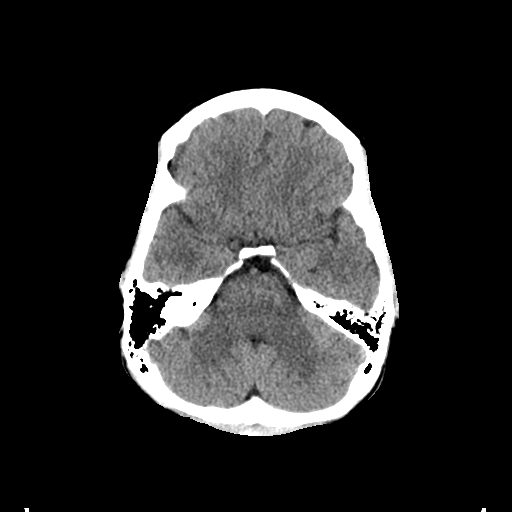
[im 11/30  brain]
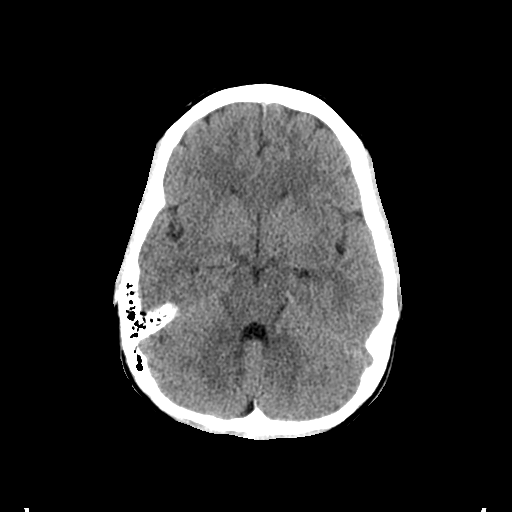
[im 11/30  bone]
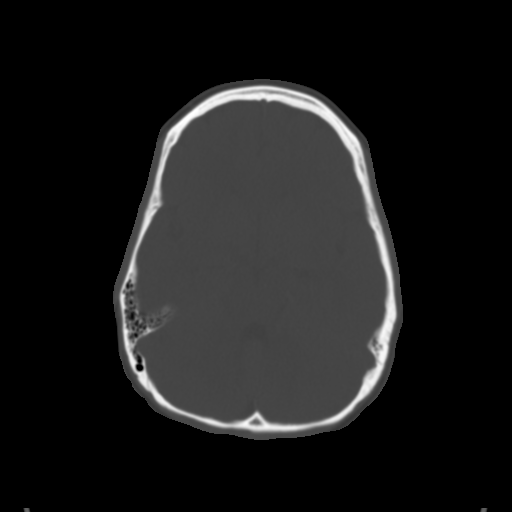
[im 13/30  brain]
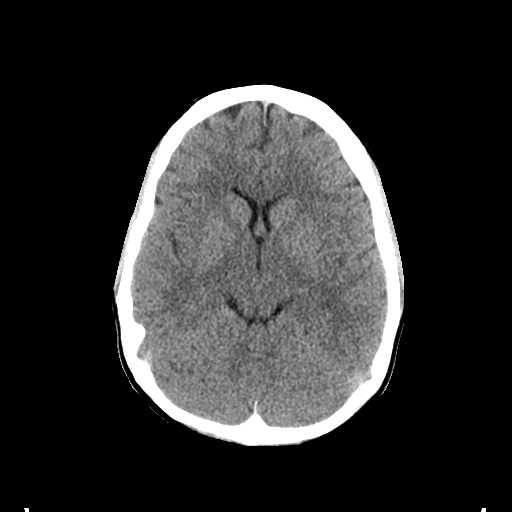
[im 15/30  brain]
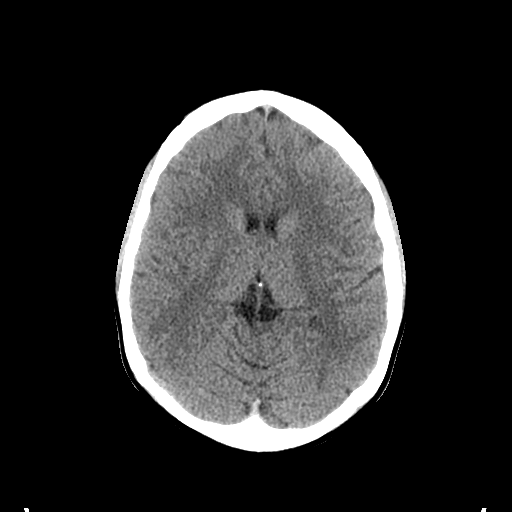
[im 17/30  brain]
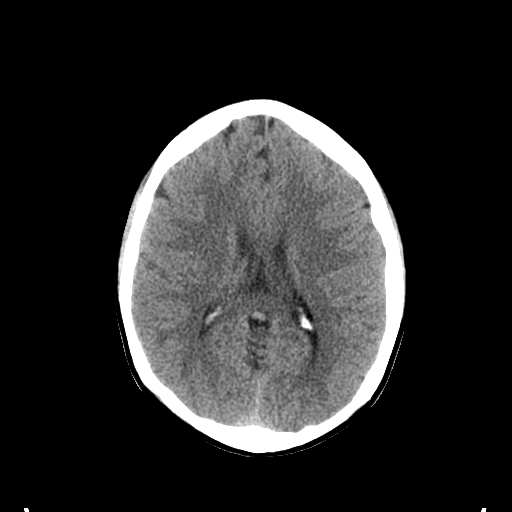
[im 19/30  brain]
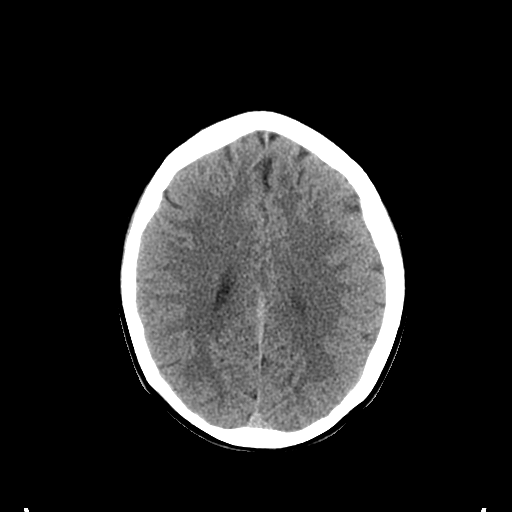
[im 19/30  bone]
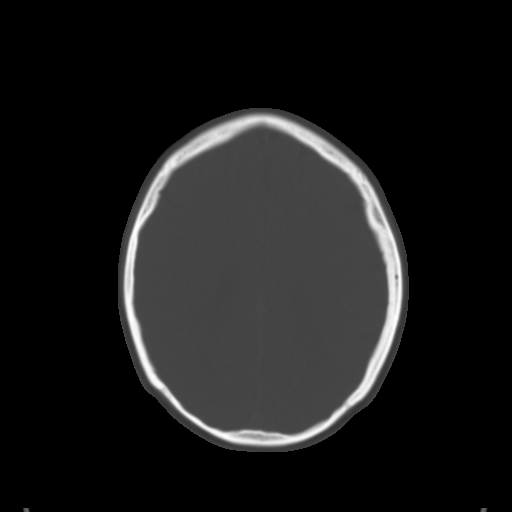
[im 21/30  brain]
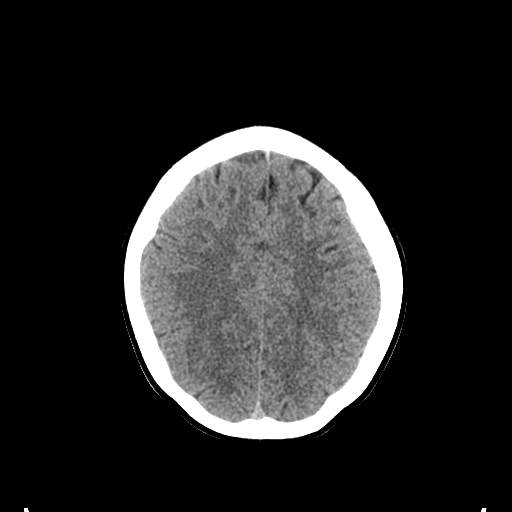
[im 23/30  brain]
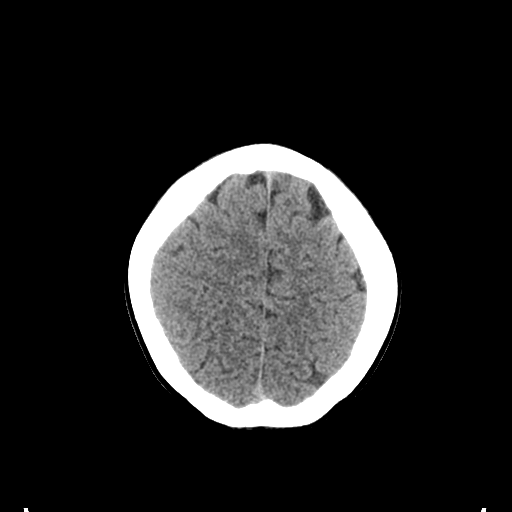
[im 25/30  brain]
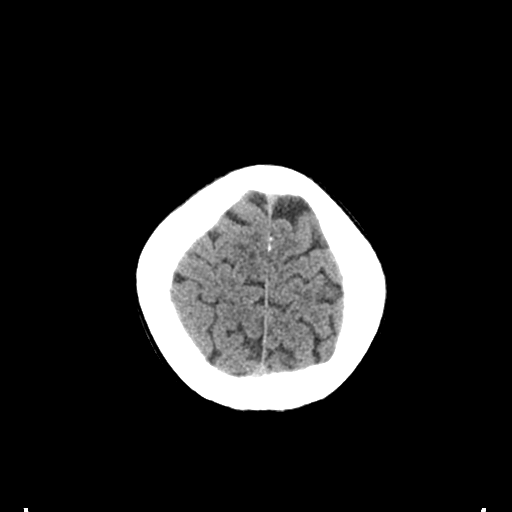
[im 27/30  brain]
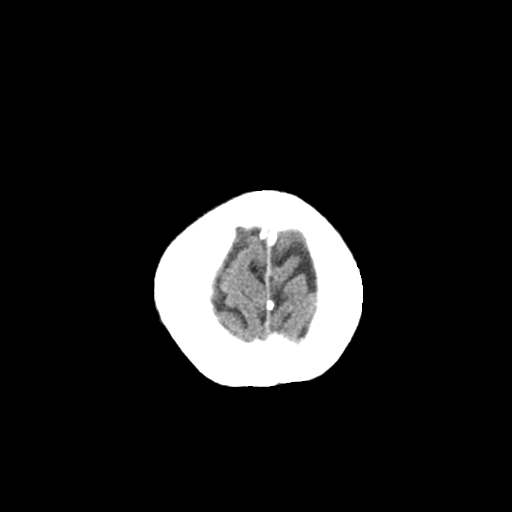
[im 27/30  bone]
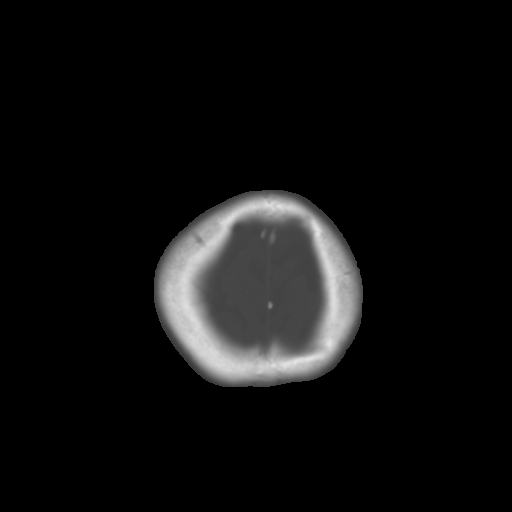

[Series 3: bone windows · axial · 0.48mm/px · z∈[+1517,+1557]mm · 3 of 30 slices shown]
[im 3/30  bone]
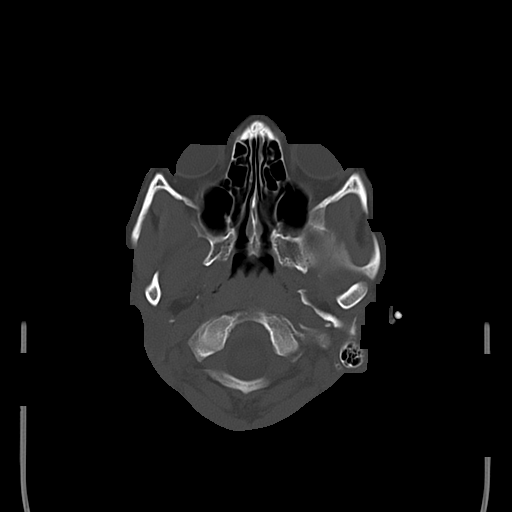
[im 7/30  bone]
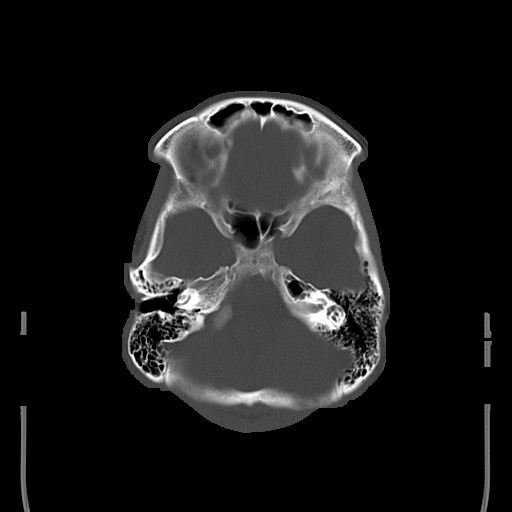
[im 11/30  bone]
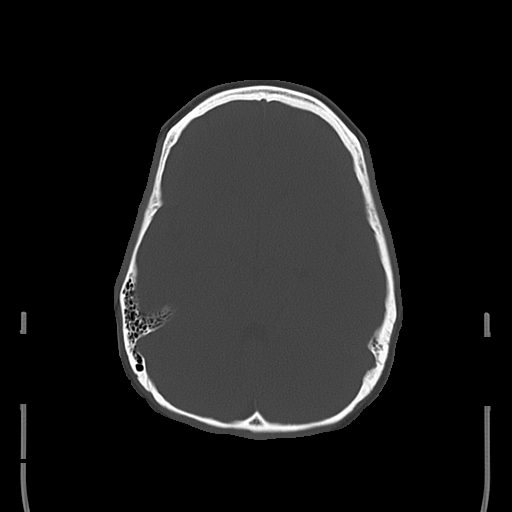

[16 of 30 positions shown; findings below may reference images not displayed]

FINDINGS: No mass lesion. No midline shift. No acute hemorrhage or hematoma.
No extra-axial fluid collections. No evidence of acute infarction.
Brain parenchyma is normal. Osseous structures are normal.
Ventricles are normal.
IMPRESSION: Normal exam.
